# Patient Record
Sex: Male | Born: 1990 | Race: Black or African American | Hispanic: No | Marital: Single | Smoking: Current every day smoker
Health system: Southern US, Community
[De-identification: ages and names within clinical notes are randomized; demographics above are authoritative.]

## PROBLEM LIST (undated history)

## (undated) DIAGNOSIS — B20 Human immunodeficiency virus [HIV] disease: Secondary | ICD-10-CM

## (undated) DIAGNOSIS — Z720 Tobacco use: Secondary | ICD-10-CM

## (undated) HISTORY — DX: Tobacco use: Z72.0

---
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
## 2010-01-14 ENCOUNTER — Encounter: Payer: Self-pay | Admitting: Adult Health

## 2010-02-08 NOTE — Miscellaneous (Signed)
Summary: Bridge Counselor  Clinical Lists Changes BC called pt today to find out why he did not come to his lab appointment after he told [facility] he was coming. Pt's phone is disconnected. BC will send a letter and make home visit, if needed.  Sharol Roussel  October 06, 2009 3:13 PM

## 2010-02-08 NOTE — Assessment & Plan Note (Signed)
Summary: Nurse Visit (Infectious Disease)   Infectious Disease New Patient Intake Referring MD/Agency: [facility]  Address: [address]  [place], [place] [address]  Medical Records: Received Health Insurance / Payor: Private Employer: none      Do you have a Primary physician: No Are family members aware of patient's diagnosis?  If so, are they supportive? Mother and Grandmother- supportive Describe patient's current social support (family, friends, support groups): Good support  Medical History Medication Allergies: No   Medications: No   Tobacco Use: never  Behavioral Health Assessment Have you ever been diagnosed with depression or mental illness? No  Diagnosis: Bouts of depression since being diagnosed . Do you drink alcohol? No Do you use recreational drugs? No Do you feel you have a problem with drugs and/or alcohol? No   Have you ever been in a treatment facility for any addiction? No If you are currently using drugs and/or alcohol, would you like help for this addiction? No  HIV Intake Information When did you first test positive for HIV? 05/13/2009 Type of test Conducted: WB   Where was this test performed?  Name of Agency: [facility]  City/State: [place], [place]  Was this your first time ever being tested or HIV? No   LAST negative HIV test result: 02-2009 Name of Office: Random testing at School event    [place]: [place], [place]: [place] Risk Factor(s) for HIV: MSM  Method of Exposure to HIV: Homosexual Intercourse-Receptive Have you ever been hospitalized for any HIV-related condition? No  Have you ever been under the care of a physician for being HIV positive? No  Newly Diagnosed Patients Has a Disease Intervention Specialist from the Health Department contacted the patient? Yes.   The patient has been informed that the [facility] Department will contact ALL newly reported cases. Health Department Contact:  [phone]   (SSN is  needed for confirmation)  Reported Today: 11/10/2009 Health Department Contact:  [phone]            (SSN is needed for confirmation)  Person Reporting: prev reported/GHD Do you have any Non-HIV related medical conditions or other prior hospitalizations or surgeries? No  HIV Medications Information The patient is currently NOT taking any HIV medications.  Infection History  Patient has been diagnosed with the following opportunistic infections: Are there any other symptoms you need to discuss? No Have you received literature/education prior to this visit about HIV/AIDS? No Do you understand the meaning of a Viral Load? No Do you understand the meaning of a CD4 count? No Lab Values Education/Handout Given Yes Medication Education/Handout Given Yes  Sexual History Are you in a current relationship? No Safe Sex Counseling/Pamphlet Given Sexual History Comments: Last 6 months :1 male partner (unprotected) Last year : 73 male partners( protected )  Lifetime male partners 4 (unprotected)  No sex with females  Noe sex with IV drug users or prostitutes.  Evaluation and Follow-Up INTAKE CHECK LIST: HIV Education, Safe Sex Counseling, HIV Material Given  Prevention For Positives: 11/02/2009   Safe sex practices discussed with patient. Condoms offered. Are you in need of condoms at this time? No Our patient has been informed that condoms are always available in this clinic.   Brochure Provided for Above Organizations? Yes Name of Agency: [facility] Does patient have problems that warrant Social Worker referral? No  Immunization History:  DPT Immunization History:    DPT # 1:  historical (09/13/1990)  DPT # 2:  historical (12/13/1990)    DPT # 3:  historical (09/01/1991)    DPT # 4:  historical (08/04/1994)  Hepatitis B Immunization History:    Hepatitis B # 1:  historical (08/04/1994)    Hepatitis B # 2:  historical (08/16/2006)    Hepatitis B # 3:  historical (09/17/2006)    Hepatitis  B # 4:  historical (06/29/2009)  HIB Immunization History:    HIB # 1:  historical (09/13/1990)    HIB # 2:  historical (12/13/1990)    HIB # 3:  historical (03/04/1991)    HIB # 4:  historical (09/01/1991)  Polio Immunization History:    Polio # 1:  historical (09/13/1990)    Polio # 2:  historical (12/13/1990)    Polio # 3:  historical (09/01/1991)    Polio # 4:  historical (08/04/1994)  MMR Immunization History:    MMR # 1:  historical (09/01/1991)    MMR # 2:  historical (08/04/1994)  Influenza Immunization History:    Influenza:  historical (06/29/2009)  Tetanus/Td Immunization History:    Tetanus/Td:  historical (08/16/2006)  Pneumovax Immunization History:    Pneumovax:  historical (06/29/2009)   Depression History:      The patient is having a depressed mood most of the day and has a diminished interest in his usual daily activities.        The patient denies that he feels like life is not worth living, denies that he wishes that he were dead, and denies that he has thought about ending his life.           -  Date:  06/22/2009    CD4: 296    CD4%: 14.1    Hemoglobin: 15.2    WBC: 5.1    Platelets: 290         Medication Adherence: 11/02/2008   Adherence to medications reviewed with patient. Counseling to provide adequate adherence provided    Prevention For Positives: 11/02/2009   Safe sex practices discussed with patient. Condoms offered.        11/02/2009   Patient was screened for substance abuse and depression. Referal was made as indicated.

## 2010-02-08 NOTE — Miscellaneous (Signed)
Summary: Lab ordres   Clinical Lists Changes  Orders: Added new Test order of T-Chlamydia  Probe, urine [id number]) - Signed Added new Test order of T-CD4SP [facility]) (CD4SP) - Signed Added new Test order of T-GC Probe, urine [phone]) - Signed Added new Test order of T-CBC w/Diff [phone]) - Signed Added new Test order of T-Comprehensive Metabolic Panel [phone]) - Signed Added new Test order of T-Hepatitis B Surface Antigen [phone]) - Signed Added new Test order of T-Hepatitis B Surface Antibody [phone]) - Signed Added new Test order of T-Hepatitis B Core Antibody ([id number]) - Signed Added new Test order of T-Hepatitis C Antibody ([id number]) - Signed Added new Test order of T-Hepatitis A Antibody ([id number]) - Signed Added new Test order of T-HIV1 Quant rflx Ultra or Genotype ([id number]) - Signed Added new Test order of T-HIV Antibody  (Reflex) ([id number]) - Signed Added new Test order of T-Lipid Profile ([id number]) - Signed Added new Test order of T-RPR (Syphilis) ([id number]) - Signed Added new Test order of T-Urinalysis ([id number]) - Signed

## 2010-02-08 NOTE — Miscellaneous (Signed)
Summary: Bridge Counselor  Clinical Lists Changes BC enrolled pt into the bridge counseling program today. BC will assist pt with rescheduling and attending his appointments.  [facility]  September 07, 2009 1:40 PM

## 2010-02-08 NOTE — Progress Notes (Signed)
Summary: Paramedic Ref  Phone Note Outgoing Call   Call placed by: Tomasita Morrow RN,  August 06, 2009 11:01 AM Call placed to: BRidge counselors Summary of Call: No show for 08-03-09 new patient intake. will make Bridge Counselor referral. Tomasita Morrow RN  August 06, 2009 11:02 AM   New Problems: HIV INFECTION (ICD-042)   New Problems: HIV INFECTION (ICD-042)

## 2010-02-08 NOTE — Miscellaneous (Signed)
Summary: Bridge Counselor  Clinical Lists Changes BC called pt on August 1st and got no answer. [facility] sent a letter to pt requesting that pt call BC to discuss medical care on August 2nd. BC made a home visit on August 5th and pt was not home. BC called pt again today and still no answer. BC will send another letter to pt.  Sharol Roussel  August 17, 2009 10:36 AM

## 2010-02-08 NOTE — Miscellaneous (Signed)
Summary: Bridge Counselor  Clinical Lists Changes BC transported pt to [facility] today for his scheduled appointment with Traci Sermon. Pt was given an Rx for Bactrim due to his low CD4 count. BC discussed with pt how and when to take the medication. Pt declined a pillbox, condoms and lab tracker. BC then transported pt to [facility] in [facility] for ongoing case management services. BC will close pt's file with bridge counseling.

## 2010-02-08 NOTE — Miscellaneous (Signed)
Summary: HIPAA Restrictions  HIPAA Restrictions   Imported By: Florinda Marker 11/04/2009 14:15:22  _____________________________________________________________________  External Attachment:    Type:   Image     Comment:   External Document
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
## 2010-02-08 NOTE — Miscellaneous (Signed)
Summary: Bridge Counselor  Clinical Lists Changes BC transported pt to [facility] for intake with Tammy and lab work. BC also made a referral to [facility] for pt to have ongoing case management. Pt will return on 11/8 to see Nida Boatman and will then have an intake with [facility] at 10:30am with the [facility] office.

## 2010-02-10 NOTE — Miscellaneous (Signed)
  Clinical Lists Changes   Orders: Added new Service order of New Patient Level V [phone]) - Signed

## 2010-02-10 NOTE — Miscellaneous (Signed)
Summary: RW update  Clinical Lists Changes  Observations: Added new observation of RWPARTICIP: Yes (01/14/2010 14:02)

## 2010-02-23 ENCOUNTER — Encounter: Payer: Self-pay | Admitting: Adult Health

## 2010-03-02 NOTE — Consult Note (Signed)
Summary: [facility] Dept.: Immunization & Lab  [facility] Dept.: Immunization & Lab   Imported By: Florinda Marker 02/23/2010 10:35:24  _____________________________________________________________________  External Attachment:    Type:   Image     Comment:   External Document

## 2010-03-23 LAB — T-HELPER CELL (CD4) - (RCID CLINIC ONLY): CD4 T Cell Abs: 160 uL — ABNORMAL LOW (ref 400–2700)

## 2010-03-24 LAB — POCT INFECTIOUS MONO SCREEN: Mono Screen: NEGATIVE

## 2010-12-16 ENCOUNTER — Emergency Department (HOSPITAL_BASED_OUTPATIENT_CLINIC_OR_DEPARTMENT_OTHER)
Admission: EM | Admit: 2010-12-16 | Discharge: 2010-12-16 | Disposition: A | Discharge status: Home / Self Care | Payer: BC Managed Care – PPO | Attending: Emergency Medicine | Admitting: Emergency Medicine

## 2010-12-16 DIAGNOSIS — B029 Zoster without complications: Secondary | ICD-10-CM | POA: Insufficient documentation

## 2010-12-16 MED ORDER — NAPROXEN 500 MG PO TABS: 500.0000 mg | ORAL_TABLET | Freq: Two times a day (BID) | ORAL | Status: AC

## 2010-12-16 MED ORDER — ACYCLOVIR 400 MG PO TABS: 400.0000 mg | ORAL_TABLET | Freq: Four times a day (QID) | ORAL | Status: AC

## 2010-12-16 MED ORDER — ACYCLOVIR 400 MG PO TABS: 400.0000 mg | ORAL_TABLET | Freq: Four times a day (QID) | ORAL | Status: DC

## 2010-12-16 NOTE — ED Provider Notes (Signed)
History     CSN: [id number] Arrival date & time: 12/16/2010  6:29 PM   First MD Initiated Contact with Patient 12/16/10 1902      Chief Complaint  Patient presents with  . Rash    (Consider location/radiation/quality/duration/timing/severity/associated sxs/prior treatment) Patient is a 20 y.o. male presenting with rash. The history is provided by the patient.  Rash    patient here with rash to his left flank lower chest area x 5 days. Describes rash as tingling and painful. No medications taken prior to arrival denies any temperature at home or chills. Nothing makes her symptoms better or worse. No cough, neck pain, photophobia.  History reviewed. No pertinent past medical history.  History reviewed. No pertinent past surgical history.  No family history on file.  History  Substance Use Topics  . Smoking status: Never Smoker   . Smokeless tobacco: Never Used  . Alcohol Use: No      Review of Systems  Skin: Positive for rash.  All other systems reviewed and are negative.    Allergies  Review of patient's allergies indicates no known allergies.  Home Medications  No current outpatient prescriptions on file.  BP 125/80  Pulse 89  Temp(Src) 100.1 F (37.8 C) (Oral)  Resp 16  Ht 6' (1.829 m)  Wt 165 lb (74.844 kg)  BMI 22.38 kg/m2  SpO2 79%  Physical Exam  Nursing note and vitals reviewed. Constitutional: He is oriented to person, place, and time. He appears well-developed and well-nourished.  Non-toxic appearance. No distress.  HENT:  Head: Normocephalic and atraumatic.  Eyes: Conjunctivae, EOM and lids are normal. Pupils are equal, round, and reactive to light.  Neck: Normal range of motion. Neck supple. No tracheal deviation present. No mass present.  Cardiovascular: Normal rate, regular rhythm and normal heart sounds.  Exam reveals no gallop.   No murmur heard. Pulmonary/Chest: Effort normal and breath sounds normal. No stridor. No respiratory distress.  He has no decreased breath sounds. He has no wheezes. He has no rhonchi. He has no rales.  Abdominal: Soft. Normal appearance and bowel sounds are normal. He exhibits no distension. There is no tenderness. There is no rebound and no CVA tenderness.  Musculoskeletal: Normal range of motion. He exhibits no edema and no tenderness.       Arms: Neurological: He is alert and oriented to person, place, and time. He has normal strength. No cranial nerve deficit or sensory deficit. GCS eye subscore is 4. GCS verbal subscore is 5. GCS motor subscore is 6.  Skin: Skin is warm and dry. No abrasion and no rash noted.     Psychiatric: He has a normal mood and affect. His speech is normal and behavior is normal.    ED Course  Procedures (including critical care time)  Labs Reviewed - No data to display No results found.   No diagnosis found.    MDM  Patient to be treated for herpes zoster        Toy Baker, MD 12/16/10 [phone]

## 2010-12-16 NOTE — ED Notes (Signed)
Vesicular, urticarial rash on abdomen and back that started Monday.

## 2011-11-30 ENCOUNTER — Emergency Department (HOSPITAL_BASED_OUTPATIENT_CLINIC_OR_DEPARTMENT_OTHER): Payer: Self-pay

## 2011-11-30 ENCOUNTER — Encounter (HOSPITAL_BASED_OUTPATIENT_CLINIC_OR_DEPARTMENT_OTHER): Payer: Self-pay | Admitting: *Deleted

## 2011-11-30 ENCOUNTER — Emergency Department (HOSPITAL_BASED_OUTPATIENT_CLINIC_OR_DEPARTMENT_OTHER)
Admission: EM | Admit: 2011-11-30 | Discharge: 2011-11-30 | Disposition: A | Discharge status: Home / Self Care | Payer: Self-pay | Attending: Emergency Medicine | Admitting: Emergency Medicine

## 2011-11-30 DIAGNOSIS — M79609 Pain in unspecified limb: Secondary | ICD-10-CM | POA: Insufficient documentation

## 2011-11-30 DIAGNOSIS — M79673 Pain in unspecified foot: Secondary | ICD-10-CM

## 2011-11-30 MED ORDER — IBUPROFEN 600 MG PO TABS: 600.0000 mg | ORAL_TABLET | Freq: Four times a day (QID) | ORAL | Status: DC | PRN

## 2011-11-30 MED ORDER — IBUPROFEN 400 MG PO TABS
600.0000 mg | ORAL_TABLET | Freq: Once | ORAL | Status: AC
  Administered 2011-11-30: 600 mg via ORAL
  Filled 2011-11-30: qty 1

## 2011-11-30 NOTE — ED Provider Notes (Signed)
History     CSN: [id number]  Arrival date & time 11/30/11  1900   First MD Initiated Contact with Patient 11/30/11 2015      Chief Complaint  Patient presents with  . Foot Swelling    (Consider location/radiation/quality/duration/timing/severity/associated sxs/prior treatment) HPI Pt presenting with c/o pain in right medial surface of foot.  He does not remember any certain injury or twisting.  Area is sore to touch and he has some mild pain with walking.  Has not taken anything for his symtpoms.  No redness or rash or bruising associated.  Symptoms started yesterday.  There are no other associated systemic symptoms, there are no other alleviating or modifying factors.  History reviewed. No pertinent past medical history.  History reviewed. No pertinent past surgical history.  No family history on file.  History  Substance Use Topics  . Smoking status: Never Smoker   . Smokeless tobacco: Never Used  . Alcohol Use: No      Review of Systems ROS reviewed and all otherwise negative except for mentioned in HPI  Allergies  Review of patient's allergies indicates no known allergies.  Home Medications   Current Outpatient Rx  Name  Route  Sig  Dispense  Refill  . IBUPROFEN 600 MG PO TABS   Oral   Take 1 tablet (600 mg total) by mouth every 6 (six) hours as needed for pain.   30 tablet   0   . NAPROXEN 500 MG PO TABS   Oral   Take 1 tablet (500 mg total) by mouth 2 (two) times daily.   30 tablet   0     BP 125/79  Pulse 93  Temp 98.4 F (36.9 C) (Oral)  Resp 20  SpO2 99% Vitals reviewed Physical Exam Physical Examination: General appearance - alert, well appearing, and in no distress Mental status - alert, oriented to person, place, and time Chest - clear to auscultation, no wheezes, rales or rhonchi, symmetric air entry, no increased respiratory effort Heart - normal rate, regular rhythm, normal S1, S2, no murmurs, rubs, clicks or gallops Neurological -  alert, oriented, normal speech, strength 5/5 in extremities, sensation intact distally Musculoskeletal - ttp over medial right foot distal to ankle-no joint tenderness, deformity or swelling Extremities - peripheral pulses normal, no pedal edema, no clubbing or cyanosis Skin - normal coloration and turgor, no rashes, no suspicious skin lesions noted  ED Course  Procedures (including critical care time)  Labs Reviewed - No data to display Dg Foot Complete Right  11/30/2011  *RADIOLOGY REPORT*  Clinical Data: Medial side calcaneal pain and swelling, no known injury  RIGHT FOOT COMPLETE - 3+ VIEW  Comparison: None  Findings: Osseous mineralization normal. Joint spaces preserved. No acute fracture, dislocation, or bone destruction. No radiopaque foreign body or soft tissue gas identified.  IMPRESSION: Normal exam.   Original Report Authenticated By: Ulyses Southward, M.D.      1. Foot pain       MDM  Pt presenting with c/o pain in right medial foot.  Xray reassuring.  No signs of cellulitis, joints of ankle and foot are normal.  There is one area of medial foot that is tender to touch and painful with flexion of foot.  Will apply ASO for support, advised ibuprofen, rest, ice.  Advised to f/u with PMD as outpatient if pain does not improve after 3-5 days.  Discharged with strict return precautions.  Pt agreeable with plan.  Ethelda Chick, MD 11/30/11 2130

## 2011-11-30 NOTE — ED Notes (Signed)
Right foot has been hurting x 2 days. Swelling. No known injury.

## 2012-10-07 ENCOUNTER — Encounter (HOSPITAL_BASED_OUTPATIENT_CLINIC_OR_DEPARTMENT_OTHER): Payer: Self-pay | Admitting: *Deleted

## 2012-10-07 ENCOUNTER — Emergency Department (HOSPITAL_BASED_OUTPATIENT_CLINIC_OR_DEPARTMENT_OTHER)
Admission: EM | Admit: 2012-10-07 | Discharge: 2012-10-08 | Disposition: A | Discharge status: Home / Self Care | Payer: BC Managed Care – PPO | Attending: Emergency Medicine | Admitting: Emergency Medicine

## 2012-10-07 DIAGNOSIS — K529 Noninfective gastroenteritis and colitis, unspecified: Secondary | ICD-10-CM

## 2012-10-07 DIAGNOSIS — K5289 Other specified noninfective gastroenteritis and colitis: Secondary | ICD-10-CM | POA: Insufficient documentation

## 2012-10-07 LAB — COMPREHENSIVE METABOLIC PANEL
Albumin: 3.8 g/dL (ref 3.5–5.2)
Alkaline Phosphatase: 83 U/L (ref 39–117)
BUN: 8 mg/dL (ref 6–23)
Calcium: 9.2 mg/dL (ref 8.4–10.5)
Creatinine, Ser: 1.1 mg/dL (ref 0.50–1.35)
GFR calc Af Amer: >90 mL/min (ref >90)
Glucose, Bld: 86 mg/dL (ref 70–99)
Total Protein: 7.6 g/dL (ref 6.0–8.3)

## 2012-10-07 LAB — CBC WITH DIFFERENTIAL/PLATELET
Basophils Relative: 0 % (ref 0–1)
Eosinophils Absolute: 0.6 10*3/uL (ref 0.0–0.7)
Eosinophils Relative: 8 % — ABNORMAL HIGH (ref 0–5)
Hemoglobin: 13.1 g/dL (ref 13.0–17.0)
Lymphs Abs: 1.5 10*3/uL (ref 0.7–4.0)
MCH: 29.3 pg (ref 26.0–34.0)
MCHC: 36.9 g/dL — ABNORMAL HIGH (ref 30.0–36.0)
MCV: 79.4 fL (ref 78.0–100.0)
Monocytes Absolute: 0.9 10*3/uL (ref 0.1–1.0)
Monocytes Relative: 12 % (ref 3–12)
Neutrophils Relative %: 58 % (ref 43–77)
RBC: 4.47 MIL/uL (ref 4.22–5.81)

## 2012-10-07 MED ORDER — KETOROLAC TROMETHAMINE 30 MG/ML IJ SOLN
30.0000 mg | Freq: Once | INTRAMUSCULAR | Status: AC
  Administered 2012-10-07: 30 mg via INTRAVENOUS
  Filled 2012-10-07: qty 1

## 2012-10-07 MED ORDER — ONDANSETRON HCL 4 MG/2ML IJ SOLN
4.0000 mg | Freq: Once | INTRAMUSCULAR | Status: AC
  Administered 2012-10-07: 4 mg via INTRAVENOUS
  Filled 2012-10-07: qty 2

## 2012-10-07 MED ORDER — SODIUM CHLORIDE 0.9 % IV BOLUS (SEPSIS)
1000.0000 mL | Freq: Once | INTRAVENOUS | Status: AC
  Administered 2012-10-07: 1000 mL via INTRAVENOUS

## 2012-10-07 NOTE — ED Provider Notes (Signed)
CSN: [id number]     Arrival date & time 10/07/12  2032 History   First MD Initiated Contact with Patient 10/07/12 2242     Chief Complaint  Patient presents with  . Abdominal Pain  . Emesis  . Diarrhea   (Consider location/radiation/quality/duration/timing/severity/associated sxs/prior Treatment) Patient is a 22 y.o. male presenting with abdominal pain. The history is provided by the patient.  Abdominal Pain Pain location:  Generalized Pain quality: cramping   Pain radiates to:  Does not radiate Pain severity:  Moderate Onset quality:  Sudden Duration:  12 hours Timing:  Constant Progression:  Worsening Chronicity:  New Context: not diet changes, not recent illness, not sick contacts and not suspicious food intake   Relieved by:  Nothing Worsened by:  Nothing tried Ineffective treatments:  None tried   History reviewed. No pertinent past medical history. History reviewed. No pertinent past surgical history. No family history on file. History  Substance Use Topics  . Smoking status: Never Smoker   . Smokeless tobacco: Never Used  . Alcohol Use: No    Review of Systems  Gastrointestinal: Positive for abdominal pain.  All other systems reviewed and are negative.    Allergies  Review of patient's allergies indicates no known allergies.  Home Medications   Current Outpatient Rx  Name  Route  Sig  Dispense  Refill  . ibuprofen (ADVIL,MOTRIN) 600 MG tablet   Oral   Take 1 tablet (600 mg total) by mouth every 6 (six) hours as needed for pain.   30 tablet   0    BP 120/70  Pulse 87  Temp(Src) 100.1 F (37.8 C) (Oral)  Resp 16  Ht 6\' 1"  (1.854 m)  Wt 160 lb (72.576 kg)  BMI 21.11 kg/m2  SpO2 100% Physical Exam  Nursing note and vitals reviewed. Constitutional: He appears well-developed and well-nourished. No distress.  HENT:  Head: Normocephalic and atraumatic.  Mouth/Throat: Oropharynx is clear and moist.  Neck: Normal range of motion. Neck supple.   Cardiovascular: Normal rate, regular rhythm and normal heart sounds.   No murmur heard. Pulmonary/Chest: Effort normal and breath sounds normal. No respiratory distress. He has no wheezes.  Abdominal: Soft. Bowel sounds are normal. He exhibits no distension. There is no tenderness.  There is no RLQ ttp.  Skin: He is not diaphoretic.    ED Course  Procedures (including critical care time) Labs Review Labs Reviewed  CBC WITH DIFFERENTIAL  COMPREHENSIVE METABOLIC PANEL   Imaging Review No results found.  MDM  No diagnosis found. Patient presents here with complaints of abdominal cramping, nausea, vomiting, and diarrhea that started earlier this morning. The presentation, exam, and workup are all consistent with a viral etiology. Laboratory studies revealed no elevation of the Hobby count and electrolytes are unremarkable. There is no right lower quadrant tenderness that makes a suspicious for appendicitis. He was given intravenous fluids and medications and is feeling better. At this point I feel as though he is stable for discharge. He is to return for worsening of his symptoms or if he develops new or bothersome symptoms.    Geoffery Lyons, MD 10/08/12 [id number]

## 2012-10-07 NOTE — ED Notes (Signed)
Pt reports abd cramping today-  n/v x 2 - diarrhea 5-10 times

## 2012-10-08 MED ORDER — PROMETHAZINE HCL 25 MG PO TABS: 25.0000 mg | ORAL_TABLET | Freq: Four times a day (QID) | ORAL | Status: DC | PRN

## 2013-08-03 ENCOUNTER — Emergency Department (HOSPITAL_BASED_OUTPATIENT_CLINIC_OR_DEPARTMENT_OTHER)
Admission: EM | Admit: 2013-08-03 | Discharge: 2013-08-03 | Disposition: A | Discharge status: Home / Self Care | Payer: BC Managed Care – PPO | Attending: Emergency Medicine | Admitting: Emergency Medicine

## 2013-08-03 ENCOUNTER — Encounter (HOSPITAL_BASED_OUTPATIENT_CLINIC_OR_DEPARTMENT_OTHER): Payer: Self-pay | Admitting: Emergency Medicine

## 2013-08-03 ENCOUNTER — Emergency Department (HOSPITAL_BASED_OUTPATIENT_CLINIC_OR_DEPARTMENT_OTHER): Payer: BC Managed Care – PPO

## 2013-08-03 DIAGNOSIS — M79609 Pain in unspecified limb: Secondary | ICD-10-CM | POA: Insufficient documentation

## 2013-08-03 DIAGNOSIS — L02419 Cutaneous abscess of limb, unspecified: Secondary | ICD-10-CM | POA: Insufficient documentation

## 2013-08-03 DIAGNOSIS — L03119 Cellulitis of unspecified part of limb: Principal | ICD-10-CM

## 2013-08-03 DIAGNOSIS — L03116 Cellulitis of left lower limb: Secondary | ICD-10-CM

## 2013-08-03 DIAGNOSIS — Z23 Encounter for immunization: Secondary | ICD-10-CM | POA: Insufficient documentation

## 2013-08-03 LAB — BASIC METABOLIC PANEL
Anion gap: 10 (ref 5–15)
BUN: 7 mg/dL (ref 6–23)
CALCIUM: 9 mg/dL (ref 8.4–10.5)
CO2: 29 mEq/L (ref 19–32)
Chloride: 100 mEq/L (ref 96–112)
Creatinine, Ser: 1 mg/dL (ref 0.50–1.35)
Glucose, Bld: 92 mg/dL (ref 70–99)
POTASSIUM: 3.8 meq/L (ref 3.7–5.3)
SODIUM: 139 meq/L (ref 137–147)

## 2013-08-03 LAB — CBC WITH DIFFERENTIAL/PLATELET
BASOS PCT: 0 % (ref 0–1)
Basophils Absolute: 0 10*3/uL (ref 0.0–0.1)
EOS ABS: 0.8 10*3/uL — AB (ref 0.0–0.7)
EOS PCT: 16 % — AB (ref 0–5)
HCT: 31.3 % — ABNORMAL LOW (ref 39.0–52.0)
Hemoglobin: 11.4 g/dL — ABNORMAL LOW (ref 13.0–17.0)
Lymphocytes Relative: 19 % (ref 12–46)
Lymphs Abs: 1 10*3/uL (ref 0.7–4.0)
MCH: 28.8 pg (ref 26.0–34.0)
MCHC: 36.4 g/dL — AB (ref 30.0–36.0)
MCV: 79 fL (ref 78.0–100.0)
MONOS PCT: 10 % (ref 3–12)
Monocytes Absolute: 0.5 10*3/uL (ref 0.1–1.0)
NEUTROS PCT: 56 % (ref 43–77)
Neutro Abs: 2.8 10*3/uL (ref 1.7–7.7)
PLATELETS: 346 10*3/uL (ref 150–400)
RBC: 3.96 MIL/uL — ABNORMAL LOW (ref 4.22–5.81)
RDW: 13.7 % (ref 11.5–15.5)
WBC: 5.1 10*3/uL (ref 4.0–10.5)

## 2013-08-03 MED ORDER — TRAMADOL HCL 50 MG PO TABS: 50.0000 mg | ORAL_TABLET | Freq: Four times a day (QID) | ORAL | Status: DC | PRN

## 2013-08-03 MED ORDER — TETANUS-DIPHTH-ACELL PERTUSSIS 5-2.5-18.5 LF-MCG/0.5 IM SUSP
0.5000 mL | Freq: Once | INTRAMUSCULAR | Status: AC
  Administered 2013-08-03: 0.5 mL via INTRAMUSCULAR
  Filled 2013-08-03: qty 0.5

## 2013-08-03 MED ORDER — CLINDAMYCIN HCL 300 MG PO CAPS: 300.0000 mg | ORAL_CAPSULE | Freq: Four times a day (QID) | ORAL | Status: DC

## 2013-08-03 MED ORDER — CLINDAMYCIN HCL 150 MG PO CAPS
300.0000 mg | ORAL_CAPSULE | Freq: Once | ORAL | Status: AC
  Administered 2013-08-03: 300 mg via ORAL
  Filled 2013-08-03: qty 2

## 2013-08-03 NOTE — ED Provider Notes (Signed)
CSN: [id number]     Arrival date & time 08/03/13  [id number] History  This chart was scribed for Rolan BuccoMelanie Torina Ey, MD by Nicholos Johnsenise Iheanachor, ED scribe. This patient was seen in room MH08/MH08 and the patient's care was started at 6:59 PM.    Chief Complaint  Patient presents with  . Leg Pain    The history is provided by the patient. No language interpreter was used.   HPI Comments: Carl Bradley is a 23 y.o. male who presents to the Emergency Department complaining of left lower leg pain w/ swelling; onset a couple of days ago. Pain primarily on the lateral lower leg and into the ankle. No injury or trauma associated with current pain. Does not report any recent stings or bug bites. Unsure if tetanus is UTD. Denies chest pain, SOB,or fever.  No past medical history on file. No past surgical history on file. No family history on file. History  Substance Use Topics  . Smoking status: Never Smoker   . Smokeless tobacco: Never Used  . Alcohol Use: No    Review of Systems  Constitutional: Negative for fever.  Respiratory: Negative for shortness of breath.   Cardiovascular: Positive for leg swelling. Negative for chest pain.  Gastrointestinal: Negative for nausea and vomiting.  Musculoskeletal: Positive for arthralgias, joint swelling and myalgias. Negative for back pain and neck pain.  Skin: Negative for wound.  Neurological: Negative for weakness, numbness and headaches.  All other systems reviewed and are negative.  Allergies  Review of patient's allergies indicates no known allergies.  Home Medications   Prior to Admission medications   Medication Sig Start Date End Date Taking? Authorizing Provider  clindamycin (CLEOCIN) 300 MG capsule Take 1 capsule (300 mg total) by mouth 4 (four) times daily. X 7 days 08/03/13   Rolan BuccoMelanie Saki Legore, MD  traMADol (ULTRAM) 50 MG tablet Take 1 tablet (50 mg total) by mouth every 6 (six) hours as needed. 08/03/13   Rolan BuccoMelanie Juri Dinning, MD   Triage vitals: BP 111/57   Pulse 97  Temp(Src) 97.8 F (36.6 C) (Oral)  Resp 16  Ht 6' (1.829 m)  Wt 150 lb (68.04 kg)  BMI 20.34 kg/m2  SpO2 100%  Physical Exam  Nursing note and vitals reviewed. Constitutional: He is oriented to person, place, and time. He appears well-developed and well-nourished.  HENT:  Head: Normocephalic and atraumatic.  Neck: Normal range of motion. Neck supple.  Cardiovascular: Normal rate.   Pulses:      Dorsalis pedis pulses are 2+ on the left side.  Pulmonary/Chest: Effort normal.  Musculoskeletal: He exhibits edema and tenderness.       Left ankle: He exhibits swelling.       Left lower leg: He exhibits tenderness and swelling. He exhibits no bony tenderness.       Left foot: He exhibits swelling.  Swelling of the left lower leg to the calf, ankle, and foot. There is erythema to the lateral aspect of the calf. Does not extend to the ankle or the knee. Pulses intact in the foot. No induration or fluctuance. No bony tenderness. Tenderness on palpation of the calf primarily at the lateral aspect.   Neurological: He is alert and oriented to person, place, and time.  Skin: Skin is warm and dry.  Psychiatric: He has a normal mood and affect.    ED Course  Procedures (including critical care time) DIAGNOSTIC STUDIES: Oxygen Saturation is 100% on room air, normal by my interpretation.    COORDINATION  OF CARE: At 7:00 PM: Discussed treatment plan with patient which includes CT scan of the left lower leg. Patient agrees.    Labs Review Labs Reviewed  CBC WITH DIFFERENTIAL - Abnormal; Notable for the following:    RBC 3.96 (*)    Hemoglobin 11.4 (*)    HCT 31.3 (*)    MCHC 36.4 (*)    Eosinophils Relative 16 (*)    Eosinophils Absolute 0.8 (*)    All other components within normal limits  BASIC METABOLIC PANEL    Imaging Review US Venous Img Lower Unilateral Left  08/03/2013   CLINICAL DATA:  Left calf swelling and pain with redness and warmth to touch for 2 days with no  known injury  EXAM: LEFT LOWER EXTREMITY VENOUS DOPPLER ULTRASOUND  TECHNIQUE: Gray-scale sonography with graded compression, as well as color Doppler and duplex ultrasound were performed to evaluate the lower extremity deep venous systems from the level of the common femoral vein and including the common femoral, femoral, profunda femoral, popliteal and calf veins including the posterior tibial, peroneal and gastrocnemius veins when visible. The superficial great saphenous vein was also interrogated. Spectral Doppler was utilized to evaluate flow at rest and with distal augmentation maneuvers in the common femoral, femoral and popliteal veins.  COMPARISON:  None.  FINDINGS: Common Femoral Vein: No evidence of thrombus. Normal compressibility, respiratory phasicity and response to augmentation.  Saphenofemoral Junction: No evidence of thrombus. Normal compressibility and flow on color Doppler imaging.  Profunda Femoral Vein: No evidence of thrombus. Normal compressibility and flow on color Doppler imaging.  Femoral Vein: No evidence of thrombus. Normal compressibility, respiratory phasicity and response to augmentation.  Popliteal Vein: No evidence of thrombus. Normal compressibility, respiratory phasicity and response to augmentation.  Calf Veins: No evidence of thrombus. Normal compressibility and flow on color Doppler imaging.  Other Findings: Enlarged left inguinal lymph nodes, measuring up to about 3 cm in diameter. In the area of tenderness in the lateral anterior cath, subcutaneous edema is noted, mild.  IMPRESSION: Mild nonspecific soft tissue edematous change in the area of tenderness. No evidence of deep venous thrombosis.  Left inguinal adenopathy.   Electronically Signed   By: Esperanza Heir M.D.   On: 08/03/2013 19:50     EKG Interpretation None      MDM   Final diagnoses:  Cellulitis of left lower extremity   No evidence of DVT.  Likely cellulitis.  No evident abscess.  Will start  clindamycin, ultram for pain.  Advised elevation.  Return if no better in 48 hours or for any worsening symptoms.  TD updated.  I personally performed the services described in this documentation, which was scribed in my presence. The recorded information has been reviewed and is accurate.     Rolan Bucco, MD 08/03/13 2134

## 2013-08-03 NOTE — ED Notes (Signed)
C/o Left leg pain x 2 days with pain and swelling. Redness noted, warm and tender to touch.

## 2013-08-03 NOTE — ED Notes (Signed)
Pt having left leg pain x 2 days.  No recent travel or injury.

## 2013-08-03 NOTE — ED Notes (Signed)
Patient transported to Ultrasound 

## 2013-08-03 NOTE — Discharge Instructions (Signed)

## 2013-09-27 ENCOUNTER — Encounter (HOSPITAL_BASED_OUTPATIENT_CLINIC_OR_DEPARTMENT_OTHER): Payer: Self-pay | Admitting: Emergency Medicine

## 2013-09-27 ENCOUNTER — Emergency Department (HOSPITAL_BASED_OUTPATIENT_CLINIC_OR_DEPARTMENT_OTHER)
Admission: EM | Admit: 2013-09-27 | Discharge: 2013-09-28 | Disposition: A | Discharge status: Home / Self Care | Payer: BC Managed Care – PPO | Attending: Emergency Medicine | Admitting: Emergency Medicine

## 2013-09-27 ENCOUNTER — Emergency Department (HOSPITAL_BASED_OUTPATIENT_CLINIC_OR_DEPARTMENT_OTHER): Payer: BC Managed Care – PPO

## 2013-09-27 DIAGNOSIS — L03115 Cellulitis of right lower limb: Secondary | ICD-10-CM

## 2013-09-27 DIAGNOSIS — Z21 Asymptomatic human immunodeficiency virus [HIV] infection status: Secondary | ICD-10-CM

## 2013-09-27 DIAGNOSIS — B20 Human immunodeficiency virus [HIV] disease: Secondary | ICD-10-CM

## 2013-09-27 DIAGNOSIS — L02419 Cutaneous abscess of limb, unspecified: Secondary | ICD-10-CM | POA: Insufficient documentation

## 2013-09-27 DIAGNOSIS — L03119 Cellulitis of unspecified part of limb: Principal | ICD-10-CM

## 2013-09-27 DIAGNOSIS — K610 Anal abscess: Secondary | ICD-10-CM

## 2013-09-27 DIAGNOSIS — F17200 Nicotine dependence, unspecified, uncomplicated: Secondary | ICD-10-CM | POA: Insufficient documentation

## 2013-09-27 DIAGNOSIS — K612 Anorectal abscess: Secondary | ICD-10-CM | POA: Insufficient documentation

## 2013-09-27 DIAGNOSIS — M79609 Pain in unspecified limb: Secondary | ICD-10-CM | POA: Insufficient documentation

## 2013-09-27 HISTORY — DX: Human immunodeficiency virus (HIV) disease: B20

## 2013-09-27 LAB — CBC WITH DIFFERENTIAL/PLATELET
BASOS ABS: 0 10*3/uL (ref 0.0–0.1)
BASOS PCT: 0 % (ref 0–1)
EOS ABS: 0.8 10*3/uL — AB (ref 0.0–0.7)
EOS PCT: 10 % — AB (ref 0–5)
HCT: 31.1 % — ABNORMAL LOW (ref 39.0–52.0)
Hemoglobin: 11.2 g/dL — ABNORMAL LOW (ref 13.0–17.0)
LYMPHS ABS: 1.3 10*3/uL (ref 0.7–4.0)
Lymphocytes Relative: 16 % (ref 12–46)
MCH: 28 pg (ref 26.0–34.0)
MCHC: 36 g/dL (ref 30.0–36.0)
MCV: 77.8 fL — ABNORMAL LOW (ref 78.0–100.0)
Monocytes Absolute: 0.7 10*3/uL (ref 0.1–1.0)
Monocytes Relative: 9 % (ref 3–12)
NEUTROS PCT: 65 % (ref 43–77)
Neutro Abs: 5.2 10*3/uL (ref 1.7–7.7)
PLATELETS: 356 10*3/uL (ref 150–400)
RBC: 4 MIL/uL — AB (ref 4.22–5.81)
RDW: 15.3 % (ref 11.5–15.5)
WBC: 8 10*3/uL (ref 4.0–10.5)

## 2013-09-27 LAB — BASIC METABOLIC PANEL
ANION GAP: 12 (ref 5–15)
BUN: 7 mg/dL (ref 6–23)
CALCIUM: 8.9 mg/dL (ref 8.4–10.5)
CO2: 26 mEq/L (ref 19–32)
Chloride: 100 mEq/L (ref 96–112)
Creatinine, Ser: 0.9 mg/dL (ref 0.50–1.35)
Glucose, Bld: 92 mg/dL (ref 70–99)
POTASSIUM: 4 meq/L (ref 3.7–5.3)
SODIUM: 138 meq/L (ref 137–147)

## 2013-09-27 LAB — RAPID HIV SCREEN (WH-MAU): SUDS RAPID HIV SCREEN: REACTIVE — AB

## 2013-09-27 MED ORDER — IOHEXOL 300 MG/ML  SOLN
100.0000 mL | Freq: Once | INTRAMUSCULAR | Status: AC | PRN
  Administered 2013-09-27: 100 mL via INTRAVENOUS

## 2013-09-27 MED ORDER — MORPHINE SULFATE 4 MG/ML IJ SOLN
4.0000 mg | Freq: Once | INTRAMUSCULAR | Status: AC
  Administered 2013-09-27: 4 mg via INTRAVENOUS
  Filled 2013-09-27: qty 1

## 2013-09-27 NOTE — ED Provider Notes (Signed)
CSN: [id number]     Arrival date & time 09/27/13  1903 History  This chart was scribed for Toy Cookey, MD by [place] Peace, ED Scribe. The patient was seen in [facility]. The patient's care was started at 9:31 PM.    Chief Complaint  Patient presents with  . Abscess      HPI HPI Comments: Carl Bradley is a 23 y.o. male who presents to the Emergency Department complaining of abscess to buttocks onset 1 day ago. Pt reports some tenderness to area. He adds that he has been experiencing some pain with bowel movements. He reports history of previous abscess to same area in the past and states he had I&D performed at ED. Pt denies any noticed drainage.  Pt complains of left foot pain onset 2 weeks ago with associated swelling, skin peeling, and pruritis. Pt states that he was on some antibiotics about 1 month ago. Pt states that he put some [employer] baby lotion on the affected area without much relief. Pt is current every day smoker.   Past Medical History  Diagnosis Date  . HIV disease    History reviewed. No pertinent past surgical history. History reviewed. No pertinent family history. History  Substance Use Topics  . Smoking status: Current Every Day Smoker  . Smokeless tobacco: Never Used  . Alcohol Use: Yes     Comment: occasional    Review of Systems  Constitutional: Negative for fever, activity change, appetite change and fatigue.  HENT: Negative for congestion, facial swelling, rhinorrhea and trouble swallowing.   Eyes: Negative for photophobia and pain.  Respiratory: Negative for cough, chest tightness and shortness of breath.   Cardiovascular: Negative for chest pain and leg swelling.  Gastrointestinal: Negative for nausea, vomiting, abdominal pain, diarrhea and constipation.       Pain with bowel movements specifically to affected area.   Endocrine: Negative for polydipsia and polyuria.  Genitourinary: Negative for dysuria, urgency, decreased urine volume and  difficulty urinating.  Musculoskeletal: Negative for back pain and gait problem.  Skin: Positive for rash and wound (abscess). Negative for color change.       Peeling of plantar aspect of left foot.   Allergic/Immunologic: Negative for immunocompromised state.  Neurological: Negative for dizziness, facial asymmetry, speech difficulty, weakness, numbness and headaches.  Psychiatric/Behavioral: Negative for confusion, decreased concentration and agitation.      Allergies  Review of patient's allergies indicates no known allergies.  Home Medications   Prior to Admission medications   Medication Sig Start Date End Date Taking? Authorizing Provider  clindamycin (CLEOCIN) 150 MG capsule Take 3 capsules (450 mg total) by mouth 3 (three) times daily. For 10 days 09/28/13   Toy Cookey, MD  clindamycin (CLEOCIN) 300 MG capsule Take 1 capsule (300 mg total) by mouth 4 (four) times daily. X 7 days 08/03/13   Rolan Bucco, MD  oxyCODONE-acetaminophen (PERCOCET) 5-325 MG per tablet Take 1-2 tablets by mouth every 6 (six) hours as needed. 09/28/13   Toy Cookey, MD  traMADol (ULTRAM) 50 MG tablet Take 1 tablet (50 mg total) by mouth every 6 (six) hours as needed. 08/03/13   Rolan Bucco, MD   BP 129/82  Pulse 88  Temp(Src) 98.9 F (37.2 C) (Oral)  Resp 18  Ht 6' (1.829 m)  Wt 150 lb (68.04 kg)  BMI 20.34 kg/m2  SpO2 100% Physical Exam  Constitutional: He is oriented to person, place, and time. He appears well-developed and well-nourished. No distress.  HENT:  Head: Normocephalic and atraumatic.  Mouth/Throat: No oropharyngeal exudate.  Eyes: Pupils are equal, round, and reactive to light.  Neck: Normal range of motion. Neck supple.  Cardiovascular: Normal rate, regular rhythm and normal heart sounds.  Exam reveals no gallop and no friction rub.   No murmur heard. Pulmonary/Chest: Effort normal and breath sounds normal. No respiratory distress. He has no wheezes. He has no rales.   Abdominal: Soft. Bowel sounds are normal. He exhibits no distension and no mass. There is no tenderness. There is no rebound and no guarding.  Genitourinary:  Tender irregularly shaped 2 cm x 3 cm rectal mass. Significant rectal tenderness on rectal exam.   Musculoskeletal: Normal range of motion. He exhibits edema (2+ LLE pitting edema and mild erythema. Scaling of skin around plantar surface of foot. ). He exhibits no tenderness.  Edema, erythema, warmth of left lower extremity. Worse at the lateral aspect of lower leg. Neurovascularly intact distally.  Neurological: He is alert and oriented to person, place, and time.  Skin: Skin is warm and dry.  Psychiatric: He has a normal mood and affect.    ED Course  INCISION AND DRAINAGE Date/Time: 09/28/2013 12:57 AM Performed by: Toy Cookey Authorized by: Toy Cookey Consent: Verbal consent obtained. written consent not obtained. Risks and benefits: risks, benefits and alternatives were discussed Consent given by: patient Patient understanding: patient states understanding of the procedure being performed Patient consent: the patient's understanding of the procedure matches consent given Procedure consent: procedure consent matches procedure scheduled Relevant documents: relevant documents present and verified Test results: test results available and properly labeled Site marked: the operative site was marked Imaging studies: imaging studies available Required items: required blood products, implants, devices, and special equipment available Patient identity confirmed: verbally with patient Time out: Immediately prior to procedure a "time out" was called to verify the correct patient, procedure, equipment, support staff and site/side marked as required. Type: abscess Body area: anogenital Location details: perianal Anesthesia: local infiltration Local anesthetic: lidocaine 2% without epinephrine Anesthetic total: 2 ml Patient  sedated: no Scalpel size: 11 Incision type: single straight Complexity: simple Drainage: purulent Drainage amount: moderate Packing material: 1/4 in iodoform gauze Patient tolerance: Patient tolerated the procedure well with no immediate complications.   (including critical care time) Labs Review Labs Reviewed  CBC WITH DIFFERENTIAL - Abnormal; Notable for the following:    RBC 4.00 (*)    Hemoglobin 11.2 (*)    HCT 31.1 (*)    MCV 77.8 (*)    Eosinophils Relative 10 (*)    Eosinophils Absolute 0.8 (*)    All other components within normal limits  RAPID HIV SCREEN ([facility]) - Abnormal; Notable for the following:    SUDS Rapid HIV Screen Reactive (*)    All other components within normal limits  BASIC METABOLIC PANEL  T-HELPER CELLS (CD4) COUNT  HIV 1/2 CONFIRMATION    Results for orders placed during the hospital encounter of 09/27/13  CBC WITH DIFFERENTIAL      Result Value Ref Range   WBC 8.0  4.0 - 10.5 K/uL   RBC 4.00 (*) 4.22 - 5.81 MIL/uL   Hemoglobin 11.2 (*) 13.0 - 17.0 g/dL   HCT 16.1 (*) 09.6 - 04.5 %   MCV 77.8 (*) 78.0 - 100.0 fL   MCH 28.0  26.0 - 34.0 pg   MCHC 36.0  30.0 - 36.0 g/dL   RDW 40.9  81.1 - 91.4 %   Platelets 356  150 -  400 K/uL   Neutrophils Relative % 65  43 - 77 %   Neutro Abs 5.2  1.7 - 7.7 K/uL   Lymphocytes Relative 16  12 - 46 %   Lymphs Abs 1.3  0.7 - 4.0 K/uL   Monocytes Relative 9  3 - 12 %   Monocytes Absolute 0.7  0.1 - 1.0 K/uL   Eosinophils Relative 10 (*) 0 - 5 %   Eosinophils Absolute 0.8 (*) 0.0 - 0.7 K/uL   Basophils Relative 0  0 - 1 %   Basophils Absolute 0.0  0.0 - 0.1 K/uL  BASIC METABOLIC PANEL      Result Value Ref Range   Sodium 138  137 - 147 mEq/L   Potassium 4.0  3.7 - 5.3 mEq/L   Chloride 100  96 - 112 mEq/L   CO2 26  19 - 32 mEq/L   Glucose, Bld 92  70 - 99 mg/dL   BUN 7  6 - 23 mg/dL   Creatinine, Ser 1.32  0.50 - 1.35 mg/dL   Calcium 8.9  8.4 - 44.0 mg/dL   GFR calc non Af Amer >90  >90 mL/min   GFR  calc Af Amer >90  >90 mL/min   Anion gap 12  5 - 15  RAPID HIV SCREEN ([facility])      Result Value Ref Range   SUDS Rapid HIV Screen Reactive (*) NON REACTIVE   No results found.    Imaging Review Ct Pelvis W Contrast  09/28/2013   CLINICAL DATA:  Perianal abscess  EXAM: CT PELVIS WITH CONTRAST  TECHNIQUE: Multidetector CT imaging of the pelvis was performed using the standard protocol following the bolus administration of intravenous contrast.  CONTRAST:  OMNIPAQUE IOHEXOL 300 MG/ML  SOLN  COMPARISON:  None.  FINDINGS: There is low attenuation and fluid collection left of the anus at the 5 o'clock position measuring 10 x 5 mm. There is suggestive of fistulous tract to the left aspect the gluteal cleft seen on the delayed imaging (image 48, series 6). These soft tissues are not well evaluated by CT. There is no evidence of gas in the peritoneum.  In the pelvis, there is no evidence of abscess within the deep tissues of the retroperitoneum are peritoneal space. Bladder is normal. Rectum is grossly normal. No aggressive osseous lesion.  IMPRESSION: 1. Left perianal abscess with likely fistulous tract to the medial aspect of the left gluteal fold. Perianal abscesses are much better evaluated with MRI. Consider MRI for further characterization. 2. No evidence extension into the deep pelvis.   Electronically Signed   By: Genevive Bi M.D.   On: 09/28/2013 00:16     EKG Interpretation None     Medications  morphine 4 MG/ML injection 4 mg (4 mg Intravenous Given 09/27/13 2156)  iohexol (OMNIPAQUE) 300 MG/ML solution 100 mL (100 mLs Intravenous Contrast Given 09/27/13 2327)  HYDROmorphone (DILAUDID) injection 1 mg (1 mg Intravenous Given 09/28/13 0041)    9:37 PM- Treatment plan was discussed with patient who verbalizes understanding and agrees.   MDM   Final diagnoses:  HIV (human immunodeficiency virus infection)  Perianal abscess  Cellulitis of right leg    Pt is a 23 y.o. male  with report of no Pmhx, but has +hx of HIV since 2011 on chart review, who presents with several days of painful rectal mass, and reoccurrence of RLE pain/redness/edema and scaling of the skin around plantar surface of his foot. He was  seen for same on 08/02/13 for same and had neg DVT study, and PE is unchanged compared to that date.  I suspect recurrent cellulitis. He states symptoms had somewhat improved with PO abx at last visit. CT abdomen and pelvis ordered to rule out deep abscess.  When I ask him about his HIV states he denies having HIV and states he tested negative several weeks ago. He agrees to be tested tonight.   Rapid HIV is reactive and confirmation to be sent.    CT abdomen pelvis with left perianal abscess with likely fistulous tract into the medial aspect of the left gluteal fold. No evidence of extension into the deep pelvis. Attack I&D done with packing placed. He was placed on by mouth clindamycin and will given Percocet for pain. I strongly suggested that he followup with infectious disease for treatment of HIV and recurrent skin infections. And recommend he followup for general surgery for AV perianal abscess. Return precautions given for new worsening symptoms including worsening pain, fever, swelling.   I personally performed the services described in this documentation, which was scribed in my presence. The recorded information has been reviewed and is accurate.     Toy Cookey, MD 09/28/13 [phone]

## 2013-09-27 NOTE — ED Notes (Signed)
Pt reports abscess to sacral area that started yesterday. Also c/o left foot painful and swollen with skin peeling.

## 2013-09-28 LAB — HIV 1/2 CONFIRMATION
HIV 2 AB: NEGATIVE
HIV-1 antibody: POSITIVE — AB

## 2013-09-28 MED ORDER — CLINDAMYCIN HCL 150 MG PO CAPS: 450.0000 mg | ORAL_CAPSULE | Freq: Three times a day (TID) | ORAL | Status: DC

## 2013-09-28 MED ORDER — HYDROMORPHONE HCL 1 MG/ML IJ SOLN
1.0000 mg | Freq: Once | INTRAMUSCULAR | Status: AC
  Administered 2013-09-28: 1 mg via INTRAVENOUS
  Filled 2013-09-28: qty 1

## 2013-09-28 MED ORDER — OXYCODONE-ACETAMINOPHEN 5-325 MG PO TABS: 1.0000 | ORAL_TABLET | Freq: Four times a day (QID) | ORAL | Status: DC | PRN

## 2013-09-28 NOTE — Discharge Instructions (Signed)
Perianal Abscess °An abscess is an infected area that contains a collection of pus and debris. A perianal abscess is one that occurs in the perineal area, which is the area between the anus and the scrotum in males and between the anus and the vagina in females. Perianal abscesses can vary in size. Without treatment, a perianal abscess can become larger and cause other problems. °CAUSES  °Glands in the perineal area can become plugged up with debris. When this happens, an abscess may form.  °SIGNS AND SYMPTOMS  °The most common symptoms of a perianal abscess are: °· Swelling and redness in the area of the abscess. The redness may go beyond the abscess and appear as a red streak on the skin. °· Pain in the area of the abscess. °Other possible symptoms include:  °· A visible lump or a lump that can be felt when touching the area and is usually painful. °· Bleeding or pus-like discharge from the area. °· Fever. °· General weakness. °DIAGNOSIS  °Your health care provider will take a medical history and examine the area. This may involve examining the rectal area with a gloved hand (digital rectal exam). For women, it may require a careful vaginal exam. Sometimes, the health care provider needs to look into the rectum using a probe or scope. °TREATMENT  °Treatment often requires making a cut (incision) in the abscess to drain the pus. This can sometimes be done in your health care provider's office or an emergency department after giving you medicine to numb the area (local anesthetic). For larger or deeper abscesses, surgery may be required to drain the abscess. Antibiotic medicines are sometimes given if there is infection of the surrounding tissue (cellulitis). In some cases, gauze is packed into the abscess to continue draining the area. Frequent sitz baths may be recommended to help the wound heal and to reduce the chance of the abscess coming back. °HOME CARE INSTRUCTIONS  °· Only take over-the-counter or  prescription medicines for pain, fever, or discomfort as directed by your health care provider. °· Take antibiotic medicine as directed. Make sure you finish it even if you start to feel better. °· If gauze is used in the abscess, follow your health care provider's instructions for removing or changing the gauze. It can usually be removed in 2-3 days. °· If one or more drains have been placed in the abscess cavity, be careful not to pull at them. Your health care provider will tell you how long they need to remain in place. °· Take warm sitz baths 3-4 times a day and after bowel movements. This will help reduce pain and swelling. °· Keep the skin around the abscess clean and dry. Avoid cleaning the area too much. °· Avoid scratching the abscess area. °· Avoid using colored or perfumed toilet papers. °SEEK MEDICAL CARE IF:  °· You have trouble having a bowel movement or passing urine. °· Your pain or swelling in the affected area does not seem to be improving. °· The gauze packing or the drains come out before the planned time. °SEEK IMMEDIATE MEDICAL CARE IF:  °· You have problems moving or using your legs. °· You have severe or increasing pain. °· Your swelling in the affected area suddenly gets worse. °· You have a large increase in bleeding or passing of pus. °· You have chills or a fever. °MAKE SURE YOU:  °· Understand these instructions. °· Will watch your condition. °· Will get help right away if you are   not doing well or get worse. Document Released: 02/01/2006 Document Revised: 10/16/2012 Document Reviewed: 08/07/2012 [facility] Patient Information 2015 [place], [place]. This information is not intended to replace advice given to you by your health care provider. Make sure you discuss any questions you have with your health care provider. Cellulitis Cellulitis is an infection of the skin and the tissue beneath it. The infected area is usually red and tender. Cellulitis occurs most often in the arms and  lower legs.  CAUSES  Cellulitis is caused by bacteria that enter the skin through cracks or cuts in the skin. The most common types of bacteria that cause cellulitis are staphylococci and streptococci. SIGNS AND SYMPTOMS   Redness and warmth.  Swelling.  Tenderness or pain.  Fever. DIAGNOSIS  Your health care provider can usually determine what is wrong based on a physical exam. Blood tests may also be done. TREATMENT  Treatment usually involves taking an antibiotic medicine. HOME CARE INSTRUCTIONS   Take your antibiotic medicine as directed by your health care provider. Finish the antibiotic even if you start to feel better.  Keep the infected arm or leg elevated to reduce swelling.  Apply a warm cloth to the affected area up to 4 times per day to relieve pain.  Take medicines only as directed by your health care provider.  Keep all follow-up visits as directed by your health care provider. SEEK MEDICAL CARE IF:   You notice red streaks coming from the infected area.  Your red area gets larger or turns dark in color.  Your bone or joint underneath the infected area becomes painful after the skin has healed.  Your infection returns in the same area or another area.  You notice a swollen bump in the infected area.  You develop new symptoms.  You have a fever. SEEK IMMEDIATE MEDICAL CARE IF:   You feel very sleepy.  You develop vomiting or diarrhea.  You have a general ill feeling (malaise) with muscle aches and pains. MAKE SURE YOU:   Understand these instructions.  Will watch your condition.  Will get help right away if you are not doing well or get worse. Document Released: 10/05/2004 Document Revised: 05/12/2013 Document Reviewed: 03/13/2011 [facility] Patient Information 2015 [place], [place]. This information is not intended to replace advice given to you by your health care provider. Make sure you discuss any questions you have with your health care  provider.   Sitz Bath A sitz bath is a warm water bath taken in the sitting position that covers only the hips and buttocks. It may be used for either healing or hygiene purposes. Sitz baths are also used to relieve pain, itching, or muscle spasms. The water may contain medicine. Moist heat will help you heal and relax.  HOME CARE INSTRUCTIONS  Take 3 to 4 sitz baths a day. 1. Fill the bathtub half full with warm water. 2. Sit in the water and open the drain a little. 3. Turn on the warm water to keep the tub half full. Keep the water running constantly. 4. Soak in the water for 15 to 20 minutes. 5. After the sitz bath, pat the affected area dry first. SEEK MEDICAL CARE IF:  You get worse instead of better. Stop the sitz baths if you get worse. MAKE SURE YOU:  Understand these instructions.  Will watch your condition.  Will get help right away if you are not doing well or get worse. Document Released: 09/18/2003 Document Revised: 09/20/2011 Document Reviewed:  03/25/2010 [employer] Patient Information 2015 [place], [place]. This information is not intended to replace advice given to you by your health care provider. Make sure you discuss any questions you have with your health care provider.  HIV Antibody Test HIV is a virus which destroys our body's ability to fight illness. It does this by causing defects in our immune system. This is the system that protects our body against infections. This virus is the cause of an illness called AIDS. HIV antibodies are made by the infected person's body when a person becomes infected with HIV. WHAT IS THE HIV ANTIBODY TEST? This is a test for HIV antibodies that are found in the blood of an infected person. This test is not a test for AIDS. It only means that you have been infected with HIV and may eventually develop AIDS. WHO IS AT RISK OF BEING INFECTED WITH HIV?  People who have unsafe sex (unsafe sex means having sex without a condom (or other  protective barrier) with a person who has the virus.  People who share IV needles or syringes with a person who has the virus.  Anyone who got blood, blood products, or organ transplants before 1985.  Babies born to mothers who have HIV.  Coming in contact with blood or other body fluids of someone infected with HIV. WHAT DOES A NEGATIVE HIV TEST RESULT MEAN? HIV antibodies were not found in your blood. Most of the time it takes our bodies between 6 weeks and 6 months to develop antibodies to HIV. It may take up to one year to develop. During this time, infected people can have a negative result even if they have the virus and will therefore not know if they are putting other people at risk. They should take all necessary precautions to protect others from becoming infected. WHAT DOES A POSITIVE HIV TEST RESULT MEAN?  A positive HIV test means a person has been infected with the HIV virus. This does NOT mean that a person has AIDS, but they may eventually develop it.  A person can give HIV infection to other people through unsafe sex. Sharing IV needles or syringes can also spread HIV.  A woman who has HIV can give the virus to her baby during pregnancy or at birth or possibly from breastfeeding. Get counseling prior to considering pregnancy. One third to one half of women with HIV infection will pass this infection on to their baby.  Anyone with a positive test for HIV should not donate blood, plasma, blood products, organs or tissues. WHERE CAN I GO TO BE TESTED?  Most county health departments offer HIV Antibody counseling and testing.  Many doctors and other caregivers offer HIV Antibody counseling and testing. If you received a blood test from your caregiver, call for your results as instructed. Remember it is your responsibility to get the results of your test. Do not assume everything is fine if you do not hear from your caregiver. If you get a positive test result, talk to your  caregiver to find out what steps to take to assure you receive the best of care. Numerous medications are now available which improve the course of this infection. Document Released: 12/24/1999 Document Revised: 03/20/2011 Document Reviewed: 03/28/2013 [facility] Patient Information 2015 [place], [place]. This information is not intended to replace advice given to you by your health care provider. Make sure you discuss any questions you have with your health care provider.    Emergency Department Resource Guide 1)  Find a Doctor and Pay Out of Pocket Although you won't have to find out who is covered by your insurance plan, it is a good idea to ask around and get recommendations. You will then need to call the office and see if the doctor you have chosen will accept you as a new patient and what types of options they offer for patients who are self-pay. Some doctors offer discounts or will set up payment plans for their patients who do not have insurance, but you will need to ask so you aren't surprised when you get to your appointment.  2) Contact Your Local Health Department Not all health departments have doctors that can see patients for sick visits, but many do, so it is worth a call to see if yours does. If you don't know where your local health department is, you can check in your phone book. The CDC also has a tool to help you locate your state's health department, and many state websites also have listings of all of their local health departments.  3) Find a Walk-in Clinic If your illness is not likely to be very severe or complicated, you may want to try a walk in clinic. These are popping up all over the country in pharmacies, drugstores, and shopping centers. They're usually staffed by nurse practitioners or physician assistants that have been trained to treat common illnesses and complaints. They're usually fairly quick and inexpensive. However, if you have serious medical issues or chronic  medical problems, these are probably not your best option.  No Primary Care Doctor: - Call Health Connect at  [phone] - they can help you locate a primary care doctor that  accepts your insurance, provides certain services, etc. - Physician Referral Service- [phone]  Chronic Pain Problems: Organization         Address  Phone   Notes  [facility]  [phone] Patients need to be referred by their primary care doctor.   Medication Assistance: Organization         Address  Phone   Notes  [facility] Medication [facility] [address] [place] [place], [place] [address] [phone] --Must be a resident of [facility] -- Must have NO insurance coverage whatsoever (no Medicaid/ Medicare, etc.) -- The pt. MUST have a primary care doctor that directs their care regularly and follows them in the community   [facility]  [phone]   [employer]  [phone]    Agencies that provide inexpensive medical care: Organization         Address  Phone   Notes  [facility]  [phone]   [facility]    [phone]   [facility] [address] [place], [place] [address] [phone]   [facility] [address] [place] [place] [phone]   Planned Parenthood    [phone]   [facility]    [phone]   [facility]  [address], [place] Phone:  [phone], Fax:  [phone] Hours of Operation:  9 am - 6 pm, M-F.  Also accepts Medicaid/Medicare and self-pay.  [facility]  [address], [place] Phone: [phone], Fax: [phone]. Hours of Operation:  8:30 am - 5:30 pm, M-F.  Also accepts Medicaid and self-pay.  [facility] [place] [address] [employer] Phone: (  [phone]   [facility] [address] [place], [place] [phone],  Ext. [phone] Mondays & Thursdays: 7-9 AM.  First 15 patients are seen on a first come, first serve basis.    Medicaid-accepting [facility] Providers:  Organization         Address  Phone   Notes  [facility] [address], [place] [phone] Also accepts self-pay patients.  [facility] [address] Laurell Josephs [place], [place]  [phone]   [facility] [address], [place] [phone]   [facility] [address] [place] [phone]   Renaye Rakers [address], [place]   [phone] Only accepts [place] Access [place] patients after they have their name applied to their card.   Self-Pay (no insurance) in [facility]:  Organization         Address  Phone   Notes  Sickle Cell Patients, [facility] [address] [place], [place] [phone]   [facility] [address] [place], [place] [phone]   [facility] [place]  [address], [place] [phone]   [facility]/Dr. Osei-Bonsu  [address], [place] or [address], [place] [phone] Phone number for both [place] and [place] locations is the same.  [facility] [address], [place] [phone]   [facility] [address] [place] or [address] [phone]   [facility] [address] [place] [phone], phone; [phone], fax Sees patients 1st and 3rd Saturday of every month.  Must not qualify for public or private insurance (i.e. Medicaid, Medicare, [place] Health Choice, Veterans' Benefits)  Household income should be no more than 200% of the poverty level The clinic cannot treat you if you are pregnant or think you are pregnant  Sexually transmitted diseases are not  treated at the clinic.    Dental Care: Organization         Address  Phone  Notes  [facility] Department of [facility] [address] [place], [place] [phone] Accepts children up to age 39 who are enrolled in [place] or [place] Health Choice; pregnant women with a Medicaid card; and children who have applied for Medicaid or [place] Health Choice, but were declined, whose parents can pay a reduced fee at time of service.  [facility] Department of [facility]  [address], [place] [phone] Accepts children up to age 46 who are enrolled in [place] or [place] Health Choice; pregnant women with a Medicaid card; and children who have applied for Medicaid or  Health Choice, but were declined, whose parents can pay a reduced fee at time of service.  [facility]  [address] [place], [place] [phone] Patients are seen by appointment only. Walk-ins are not accepted. [facility] will see patients 46 years of age and older. Monday - Tuesday (8am-5pm) Most Wednesdays (8:30-5pm) $30 per visit, cash only  [facility]  [address], [facility] [phone] Patients are seen by appointment only. Walk-ins are not accepted. [facility] will see patients 58 years of age and older. One Wednesday Evening (Monthly: Volunteer Based).  $  30 per visit, cash only  [employer] of Dentistry Clinics  [phone] for adults; Children under age 58, call Graduate Pediatric Dentistry at [phone]. Children aged 29-14, please call [phone] to request a pediatric application.  Dental services are provided in all areas of dental care including fillings, crowns and bridges, complete and partial dentures, implants, gum treatment, root canals, and extractions. Preventive care is also provided. Treatment is provided to both adults and children. Patients are selected via a lottery and there is  often a waiting list.   [facility] [address] [place]  [phone] [email or web address]   [facility] [address] [place], [place] [phone], Ext. [phone] Second and Fourth Thursday of each month, opens at 6:30 AM; Clinic ends at 9 AM.  Patients are seen on a first-come first-served basis, and a limited number are seen during each clinic.   [facility]  [address] [place], [place] [phone]   Eligibility Requirements You must have lived in [place], [place], or [place] counties for at least the last three months.   You cannot be eligible for state or federal sponsored [place], including [employer], [place], or [employer].   You generally cannot be eligible for healthcare insurance through your employer.    How to apply: Eligibility screenings are held every Tuesday and Wednesday afternoon from 1:00 pm until 4:00 pm. You do not need an appointment for the interview!  [facility] [address], [place], [place] [phone]   [facility] Health Department  [phone]   [facility] Health Department  [phone]   [facility] Health Department  [phone]    Behavioral Health Resources in the Community: Intensive Outpatient Programs Organization         Address  Phone  Notes  [facility] [address], [place], [place] [phone]   [facility] [address], [place], [place] [phone]   [facility] [address], [place], [place]  [phone]   [facility] Mental Health [address]  [place], [place] [phone] or [phone]   Substance Abuse Resources Organization         Address  Phone  Notes  [facility]  [phone]   [facility]  [phone]   [facility]  [phone]   Floydene Flock  [phone]   Residential & Outpatient Substance  Abuse Program  [phone]   Psychological Services Organization         Address  Phone  Notes  [facility] Health  [phone]   [facility] Services  [phone]   [facility] Mental Health [address] [place] [phone] or [phone]    Mobile Crisis Teams Organization         Address  Phone  Notes  [facility], Mobile Crisis Care Unit  [phone]   [facility]  [address] [place], [place] [phone]   Doristine Locks [address] [place] [phone]    Self-Help/Support Groups Organization         Address  Phone             Notes  Mental Health Assoc. of  - variety of support groups  336- I7437963 Call for more information  Narcotics Anonymous (NA), [facility] [address], [employer] [place]  2 meetings at this location   Statistician  Address  Phone  Notes  [facility] [address],    [place]  [phone]   [facility]  [address], [place] [address], [place], [place] [phone]   [facility] [address] [place], [place] [phone] Admissions: 8am-3pm M-F  [facility] [address]    [place], [place] [phone]   [facility] [address] [place], [place], [place] [phone]   [facility] [address],  [place], [place] [phone]   [facility] - Intensive Outpatient [address]ure[address] Joseph[address], [place], [place] [phone]   [facility] ([facility] [address] [place]  [place], [place] [phone] or [phone]   [facility] ([facility]) [address], [place], [place] [phone] Accepts Medicaid  [facility] [place] [address],  [place] [phone] Substance Abuse/Addiction Treatment   [facility] Organization          Address  Phone  Notes  [facility]  9205704402   Angie Fava, PhD [address] Ervin Knack [place], [place]   [phone] or [phone]   [facility] Behavioral   [address] [place], [place] [phone]   [facility] [address] [place], [place] [phone] Insurance/Medicaid/sponsorship through [facility] [address]                                    [place], [place] [phone] Therapy/tele-psych/case  [facility] [address], [place] [phone]    Dr. Lolly Mustache  [phone]   [facility]  [employer] [facility] Dept. 1) [address] [place] 2) [address] [place] 3)  [address], [place] [phone]  [phone]   [facility] Child Abuse Hotline ([phone] or [phone] (After Hours)

## 2013-12-14 ENCOUNTER — Emergency Department (HOSPITAL_BASED_OUTPATIENT_CLINIC_OR_DEPARTMENT_OTHER): Payer: BC Managed Care – PPO

## 2013-12-14 ENCOUNTER — Emergency Department (HOSPITAL_BASED_OUTPATIENT_CLINIC_OR_DEPARTMENT_OTHER)
Admission: EM | Admit: 2013-12-14 | Discharge: 2013-12-14 | Disposition: A | Discharge status: Home / Self Care | Payer: BC Managed Care – PPO | Attending: Emergency Medicine | Admitting: Emergency Medicine

## 2013-12-14 ENCOUNTER — Encounter (HOSPITAL_BASED_OUTPATIENT_CLINIC_OR_DEPARTMENT_OTHER): Payer: Self-pay | Admitting: *Deleted

## 2013-12-14 DIAGNOSIS — Y9389 Activity, other specified: Secondary | ICD-10-CM | POA: Insufficient documentation

## 2013-12-14 DIAGNOSIS — Z720 Tobacco use: Secondary | ICD-10-CM | POA: Diagnosis not present

## 2013-12-14 DIAGNOSIS — Z791 Long term (current) use of non-steroidal anti-inflammatories (NSAID): Secondary | ICD-10-CM | POA: Diagnosis not present

## 2013-12-14 DIAGNOSIS — S99911A Unspecified injury of right ankle, initial encounter: Secondary | ICD-10-CM | POA: Diagnosis not present

## 2013-12-14 DIAGNOSIS — Z21 Asymptomatic human immunodeficiency virus [HIV] infection status: Secondary | ICD-10-CM | POA: Diagnosis not present

## 2013-12-14 DIAGNOSIS — Y998 Other external cause status: Secondary | ICD-10-CM | POA: Insufficient documentation

## 2013-12-14 DIAGNOSIS — W228XXA Striking against or struck by other objects, initial encounter: Secondary | ICD-10-CM | POA: Insufficient documentation

## 2013-12-14 DIAGNOSIS — Z79899 Other long term (current) drug therapy: Secondary | ICD-10-CM | POA: Insufficient documentation

## 2013-12-14 DIAGNOSIS — S99921A Unspecified injury of right foot, initial encounter: Secondary | ICD-10-CM | POA: Diagnosis present

## 2013-12-14 DIAGNOSIS — Y9289 Other specified places as the place of occurrence of the external cause: Secondary | ICD-10-CM | POA: Insufficient documentation

## 2013-12-14 DIAGNOSIS — T1490XA Injury, unspecified, initial encounter: Secondary | ICD-10-CM

## 2013-12-14 DIAGNOSIS — S9031XA Contusion of right foot, initial encounter: Secondary | ICD-10-CM | POA: Insufficient documentation

## 2013-12-14 MED ORDER — NAPROXEN 500 MG PO TABS: 500.0000 mg | ORAL_TABLET | Freq: Two times a day (BID) | ORAL | Status: DC

## 2013-12-14 NOTE — ED Notes (Signed)
Pt c/o right foot pain x 1 day

## 2013-12-14 NOTE — Discharge Instructions (Signed)
Please read and follow all provided instructions.  Your diagnoses today include:  1. Contusion of foot, right, initial encounter   2. Injury     Tests performed today include:  An x-ray of the affected area - does NOT show any broken bones  Vital signs. See below for your results today.   Medications prescribed:   Naproxen - anti-inflammatory pain medication  Do not exceed 500mg  naproxen every 12 hours, take with food  You have been prescribed an anti-inflammatory medication or NSAID. Take with food. Take smallest effective dose for the shortest duration needed for your pain. Stop taking if you experience stomach pain or vomiting.   Take any prescribed medications only as directed.  Home care instructions:   Follow any educational materials contained in this packet  Follow R.I.C.E. Protocol:  R - rest your injury   I  - use ice on injury without applying directly to skin  C - compress injury with bandage or splint  E - elevate the injury as much as possible  Follow-up instructions: Please follow-up with your primary care provider if you continue to have significant pain or trouble walking in 1 week. In this case you may have a severe injury that requires further care.   Return instructions:   Please return if your toes are numb or tingling, appear gray or blue, or you have severe pain (also elevate leg and loosen splint or wrap if you were given one)  Please return to the Emergency Department if you experience worsening symptoms.   Please return if you have any other emergent concerns.  Additional Information:  Your vital signs today were: BP 116/71 mmHg   Pulse 73   Temp(Src) 98.2 F (36.8 C) (Oral)   Resp 18   Ht 6' (1.829 m)   Wt 160 lb (72.576 kg)   BMI 21.70 kg/m2   SpO2 100% If your blood pressure (BP) was elevated above 135/85 this visit, please have this repeated by your doctor within one month. -------------- If prescribed crutches for your injury: use  crutches with non-weight bearing for the first few days. Then, you may walk as the pain allows, or as instructed. Start gradually with weight bearing on the affected side. Once you can walk pain free, then try jogging. When you can run forwards, then you can try moving side-to-side. If you cannot walk without crutches in one week, you need a re-check. --------------

## 2013-12-14 NOTE — ED Provider Notes (Signed)
CSN: [id number]     Arrival date & time 12/14/13  1253 History   First MD Initiated Contact with Patient 12/14/13 1254     Chief Complaint  Patient presents with  . Foot Pain     (Consider location/radiation/quality/duration/timing/severity/associated sxs/prior Treatment) HPI Comments: Patient presents with complaint of right foot swelling and pain starting acutely last night when he struck his right foot on a piece of furniture. Patient has been ambulatory but with pain. He has been elevating and applying ice. Patient took ibuprofen 1. No numbness or tingling. No knee or hip injuries. No back pain. Course is constant. Walking makes the pain worse.  Patient is a 23 y.o. male presenting with lower extremity pain. The history is provided by the patient.  Foot Pain Associated symptoms include arthralgias and joint swelling. Pertinent negatives include no neck pain, numbness or weakness.    Past Medical History  Diagnosis Date  . HIV disease    History reviewed. No pertinent past surgical history. History reviewed. No pertinent family history. History  Substance Use Topics  . Smoking status: Current Every Day Smoker -- 0.50 packs/day    Types: Cigarettes  . Smokeless tobacco: Never Used  . Alcohol Use: Yes     Comment: occasional    Review of Systems  Constitutional: Negative for activity change.  Musculoskeletal: Positive for joint swelling and arthralgias. Negative for back pain, gait problem and neck pain.  Skin: Negative for wound.  Neurological: Negative for weakness and numbness.    Allergies  Review of patient's allergies indicates no known allergies.  Home Medications   Prior to Admission medications   Medication Sig Start Date End Date Taking? Authorizing Provider  naproxen (NAPROSYN) 500 MG tablet Take 1 tablet (500 mg total) by mouth 2 (two) times daily. 12/14/13   Renne CriglerJoshua Ladeja Pelham, PA-C  traMADol (ULTRAM) 50 MG tablet Take 1 tablet (50 mg total) by mouth every 6  (six) hours as needed. 08/03/13   Rolan BuccoMelanie Belfi, MD   BP 116/71 mmHg  Pulse 73  Temp(Src) 98.2 F (36.8 C) (Oral)  Resp 18  Ht 6' (1.829 m)  Wt 160 lb (72.576 kg)  BMI 21.70 kg/m2  SpO2 100%   Physical Exam  Constitutional: He appears well-developed and well-nourished.  HENT:  Head: Normocephalic and atraumatic.  Eyes: Conjunctivae are normal.  Neck: Normal range of motion. Neck supple.  Cardiovascular: Normal pulses.   Pulses:      Dorsalis pedis pulses are 2+ on the right side, and 2+ on the left side.       Posterior tibial pulses are 2+ on the right side, and 2+ on the left side.  Musculoskeletal: He exhibits edema and tenderness.       Right knee: Normal.       Right ankle: He exhibits swelling. He exhibits normal range of motion. Tenderness.       Right lower leg: He exhibits no tenderness.       Right foot: There is tenderness, bony tenderness and swelling. There is normal range of motion.       Feet:  Neurological: He is alert. No sensory deficit.  Motor, sensation, and vascular distal to the injury is fully intact.   Skin: Skin is warm and dry.  Psychiatric: He has a normal mood and affect.  Nursing note and vitals reviewed.   ED Course  Procedures (including critical care time) Labs Review Labs Reviewed - No data to display  Imaging Review Dg Foot Complete Right  12/14/2013   CLINICAL DATA:  Right foot pain and tenderness, trauma  EXAM: RIGHT FOOT COMPLETE - 3+ VIEW  COMPARISON:  11/30/2011  FINDINGS: There is no evidence of fracture or dislocation. There is no evidence of arthropathy or other focal bone abnormality. Soft tissues are unremarkable.  IMPRESSION: Negative.   Electronically Signed   By: Christiana PellantGretchen  Green M.D.   On: 12/14/2013 13:45     EKG Interpretation None      2:03 PM Patient seen and examined.   Vital signs reviewed and are as follows: BP 116/71 mmHg  Pulse 73  Temp(Src) 98.2 F (36.8 C) (Oral)  Resp 18  Ht 6' (1.829 m)  Wt 160 lb  (72.576 kg)  BMI 21.70 kg/m2  SpO2 100%   X-ray neg. Pt informed.    MDM   Final diagnoses:  Contusion of foot, right, initial encounter   Patient with contusion of foot, x-ray neg. foot is neurovascularly intact. No knee pain or hip pain. Conservative measures indicated with PCP follow-up if not improved.    Renne CriglerJoshua Harjit Douds, PA-C 12/14/13 1410  Enid SkeensJoshua M Zavitz, MD 12/14/13 [phone]

## 2014-01-08 ENCOUNTER — Emergency Department (HOSPITAL_BASED_OUTPATIENT_CLINIC_OR_DEPARTMENT_OTHER)
Admission: EM | Admit: 2014-01-08 | Discharge: 2014-01-08 | Disposition: A | Discharge status: Home / Self Care | Payer: BC Managed Care – PPO | Attending: Emergency Medicine | Admitting: Emergency Medicine

## 2014-01-08 ENCOUNTER — Encounter (HOSPITAL_BASED_OUTPATIENT_CLINIC_OR_DEPARTMENT_OTHER): Payer: Self-pay | Admitting: *Deleted

## 2014-01-08 DIAGNOSIS — Z720 Tobacco use: Secondary | ICD-10-CM | POA: Diagnosis not present

## 2014-01-08 DIAGNOSIS — M79672 Pain in left foot: Secondary | ICD-10-CM

## 2014-01-08 DIAGNOSIS — G8911 Acute pain due to trauma: Secondary | ICD-10-CM | POA: Insufficient documentation

## 2014-01-08 DIAGNOSIS — M25571 Pain in right ankle and joints of right foot: Secondary | ICD-10-CM | POA: Insufficient documentation

## 2014-01-08 DIAGNOSIS — M79671 Pain in right foot: Secondary | ICD-10-CM

## 2014-01-08 DIAGNOSIS — Z21 Asymptomatic human immunodeficiency virus [HIV] infection status: Secondary | ICD-10-CM | POA: Diagnosis not present

## 2014-01-08 DIAGNOSIS — M25572 Pain in left ankle and joints of left foot: Secondary | ICD-10-CM | POA: Insufficient documentation

## 2014-01-08 MED ORDER — NAPROXEN 500 MG PO TABS: 500.0000 mg | ORAL_TABLET | Freq: Two times a day (BID) | ORAL | Status: DC

## 2014-01-08 NOTE — Discharge Instructions (Signed)
Return to the ED with any concerns including increased pain, swelling of foot or legs, redness of skin, fever/chills, decreased level of alertness/lethargy, or any other alarming symptoms

## 2014-01-08 NOTE — ED Provider Notes (Signed)
CSN: [id number]     Arrival date & time 01/08/14  [id number] History   First MD Initiated Contact with Patient 01/08/14 [phone]     Chief Complaint  Patient presents with  . Foot Pain     (Consider location/radiation/quality/duration/timing/severity/associated sxs/prior Treatment) HPI  Pt presents with c/o bilateral foot pain.  He states he dropped a heavy piece of furniture on top of his foot approx 4 weeks ago- xrays were negative.  He has continued to have pain in that foot on the top- but worse this morning.  No new injury.  He was at work and states he couldn't stand on his feet- also c/o pain in the top of his left foot.  States that has been going on for an even longer time period- no trauma to this foot.  No fever.  [facility], infectious disease.  He has tried ibuprofen without much relief.  There are no other associated systemic symptoms, there are no other alleviating or modifying factors.   Past Medical History  Diagnosis Date  . HIV disease    History reviewed. No pertinent past surgical history. History reviewed. No pertinent family history. History  Substance Use Topics  . Smoking status: Current Every Day Smoker -- 0.50 packs/day    Types: Cigarettes  . Smokeless tobacco: Never Used  . Alcohol Use: Yes     Comment: occasional    Review of Systems  ROS reviewed and all otherwise negative except for mentioned in HPI    Allergies  Review of patient's allergies indicates no known allergies.  Home Medications   Prior to Admission medications   Medication Sig Start Date End Date Taking? Authorizing Provider  naproxen (NAPROSYN) 500 MG tablet Take 1 tablet (500 mg total) by mouth 2 (two) times daily. 01/08/14   Ethelda ChickMartha K Linker, MD  naproxen (NAPROSYN) 500 MG tablet Take 1 tablet (500 mg total) by mouth 2 (two) times daily. 01/08/14   Ethelda ChickMartha K Linker, MD  traMADol (ULTRAM) 50 MG tablet Take 1 tablet (50 mg total) by mouth every 6 (six) hours  as needed. 08/03/13   Rolan BuccoMelanie Belfi, MD   BP 124/64 mmHg  Pulse 79  Temp(Src) 97.9 F (36.6 C) (Oral)  Resp 14  Ht 6' (1.829 m)  Wt 150 lb (68.04 kg)  BMI 20.34 kg/m2  SpO2 100%  Vitals reviewed Physical Exam  Physical Examination: General appearance - alert, well appearing, and in no distress Mental status - alert, oriented to person, place, and time Eyes - no conjunctival injection, no scleral icterus Chest - clear to auscultation, no wheezes, rales or rhonchi, symmetric air entry Neurological - alert, oriented x 3, cranial nerves grossly intact, stregnth 5/5 in bilateral lower extremities, sensation distally intact Musculoskeletal - no joint tenderness, deformity or swelling Extremities - peripheral pulses normal, no pedal edema, no clubbing or cyanosis Skin - normal coloration and turgor, no rashes  ED Course  Procedures (including critical care time) Labs Review Labs Reviewed - No data to display  Imaging Review No results found.   EKG Interpretation None      MDM   Final diagnoses:  Foot pain, bilateral    Pt presenting with bilateral foot pain both of which has been ongoing for several weeks.  No new truama.  Foot exams normal bialterally with both NVI.  Ad[facility] with podiatry [facility]  No signs of infectious process. Given rx for naproxen.  Discharged  with strict return precautions.  Pt agreeable with plan.    Ethelda ChickMartha K Linker, MD 01/08/14 [phone]

## 2014-01-08 NOTE — ED Notes (Signed)
Pt amb to room 10 with quick steady gait in nad. Pt reports dropping heavy tv on his foot, cont with pain to top of foot.

## 2014-03-09 ENCOUNTER — Encounter (HOSPITAL_BASED_OUTPATIENT_CLINIC_OR_DEPARTMENT_OTHER): Payer: Self-pay | Admitting: Emergency Medicine

## 2014-03-09 ENCOUNTER — Emergency Department (HOSPITAL_BASED_OUTPATIENT_CLINIC_OR_DEPARTMENT_OTHER): Payer: BLUE CROSS/BLUE SHIELD

## 2014-03-09 ENCOUNTER — Emergency Department (HOSPITAL_BASED_OUTPATIENT_CLINIC_OR_DEPARTMENT_OTHER)
Admission: EM | Admit: 2014-03-09 | Discharge: 2014-03-09 | Disposition: A | Discharge status: Home / Self Care | Payer: BLUE CROSS/BLUE SHIELD | Attending: Emergency Medicine | Admitting: Emergency Medicine

## 2014-03-09 DIAGNOSIS — Z720 Tobacco use: Secondary | ICD-10-CM | POA: Diagnosis not present

## 2014-03-09 DIAGNOSIS — Y9389 Activity, other specified: Secondary | ICD-10-CM | POA: Diagnosis not present

## 2014-03-09 DIAGNOSIS — Y998 Other external cause status: Secondary | ICD-10-CM | POA: Insufficient documentation

## 2014-03-09 DIAGNOSIS — W228XXA Striking against or struck by other objects, initial encounter: Secondary | ICD-10-CM | POA: Insufficient documentation

## 2014-03-09 DIAGNOSIS — Y9289 Other specified places as the place of occurrence of the external cause: Secondary | ICD-10-CM | POA: Insufficient documentation

## 2014-03-09 DIAGNOSIS — S6992XA Unspecified injury of left wrist, hand and finger(s), initial encounter: Secondary | ICD-10-CM | POA: Diagnosis present

## 2014-03-09 DIAGNOSIS — S56414A Strain of extensor muscle, fascia and tendon of left middle finger at forearm level, initial encounter: Secondary | ICD-10-CM | POA: Insufficient documentation

## 2014-03-09 DIAGNOSIS — Z21 Asymptomatic human immunodeficiency virus [HIV] infection status: Secondary | ICD-10-CM | POA: Diagnosis not present

## 2014-03-09 DIAGNOSIS — S66912A Strain of unspecified muscle, fascia and tendon at wrist and hand level, left hand, initial encounter: Secondary | ICD-10-CM

## 2014-03-09 DIAGNOSIS — IMO0001 Reserved for inherently not codable concepts without codable children: Secondary | ICD-10-CM

## 2014-03-09 NOTE — ED Notes (Signed)
24 yo c/o finger injury to left hand middle finger about a month ago. Painful and recent swelling. No swelling notated at triage. Pain 5/10

## 2014-03-09 NOTE — ED Provider Notes (Signed)
CSN: [id number]     Arrival date & time 03/09/14  1102 History   First MD Initiated Contact with Patient 03/09/14 1119     Chief Complaint  Patient presents with  . Finger Injury    left hand middle finger     (Consider location/radiation/quality/duration/timing/severity/associated sxs/prior Treatment) HPI Comments: Pt comes in with c/o left middle finger pain times 1 month. States that he hit it playing a game and he has continued to have pain and swelling to pip joint. Denies redness or warmth. Seemed more swollen today and that is why he cam in  The history is provided by the patient. No language interpreter was used.    Past Medical History  Diagnosis Date  . HIV disease    History reviewed. No pertinent past surgical history. No family history on file. History  Substance Use Topics  . Smoking status: Light Tobacco Smoker -- 0.50 packs/day    Types: Cigarettes  . Smokeless tobacco: Never Used  . Alcohol Use: Yes     Comment: occasional    Review of Systems  All other systems reviewed and are negative.     Allergies  Review of patient's allergies indicates no known allergies.  Home Medications   Prior to Admission medications   Medication Sig Start Date End Date Taking? Authorizing Provider  naproxen (NAPROSYN) 500 MG tablet Take 1 tablet (500 mg total) by mouth 2 (two) times daily. 01/08/14   Ethelda ChickMartha K Linker, MD  naproxen (NAPROSYN) 500 MG tablet Take 1 tablet (500 mg total) by mouth 2 (two) times daily. 01/08/14   Ethelda ChickMartha K Linker, MD  traMADol (ULTRAM) 50 MG tablet Take 1 tablet (50 mg total) by mouth every 6 (six) hours as needed. 08/03/13   Rolan BuccoMelanie Belfi, MD   BP 117/64 mmHg  Pulse 73  Temp(Src) 98.3 F (36.8 C)  Resp 16  Ht 6' (1.829 m)  Wt 150 lb (68.04 kg)  BMI 20.34 kg/m2  SpO2 97% Physical Exam  Constitutional: He is oriented to person, place, and time. He appears well-developed and well-nourished.  Cardiovascular: Normal rate and regular rhythm.    Pulmonary/Chest: Effort normal and breath sounds normal.  Musculoskeletal:  Swelling or pip of left middle finger. No redness or warmth to the area  Neurological: He is alert and oriented to person, place, and time. Coordination normal.  Skin: Skin is warm and dry.  Nursing note and vitals reviewed.   ED Course  Procedures (including critical care time) Labs Review Labs Reviewed - No data to display  Imaging Review Dg Finger Middle Left  03/09/2014   CLINICAL DATA:  Trauma left middle finger 1 month ago. Pain. Initial evaluation.  EXAM: LEFT MIDDLE FINGER 2+V  COMPARISON:  None.  FINDINGS: There is no evidence of fracture or dislocation. There is no evidence of arthropathy or other focal bone abnormality. Soft tissues are unremarkable.  IMPRESSION: Negative.   Electronically Signed   By: Maisie Fushomas  Register   On: 03/09/2014 12:28     EKG Interpretation None      MDM   Final diagnoses:  Finger strain, left, initial encounter    No acute fracture noted. Pt splinted for comfort and given follow up with Dr. Vivi Barrackhudnall    Carl Allnutt, NP 03/09/14 1235  [name], MD 03/09/14 1501

## 2014-03-09 NOTE — Discharge Instructions (Signed)
Finger Sprain °A finger sprain happens when the bands of tissue that hold the finger bones together (ligaments) stretch too much and tear. °HOME CARE °· Keep your injured finger raised (elevated) when possible. °· Put ice on the injured area, twice a day, for 2 to 3 days. °¨ Put ice in a plastic bag. °¨ Place a towel between your skin and the bag. °¨ Leave the ice on for 15 minutes. °· Only take medicine as told by your doctor. °· Do not wear rings on the injured finger. °· Protect your finger until pain and stiffness go away (usually 3 to 4 weeks). °· Do not get your cast or splint to get wet. Cover your cast or splint with a plastic bag when you shower or bathe. Do not swim. °· Your doctor may suggest special exercises for you to do. These exercises will help keep or stop stiffness from happening. °GET HELP RIGHT AWAY IF: °· Your cast or splint gets damaged. °· Your pain gets worse, not better. °MAKE SURE YOU: °· Understand these instructions. °· Will watch your condition. °· Will get help right away if you are not doing well or get worse. °Document Released: 01/28/2010 Document Revised: 03/20/2011 Document Reviewed: 08/29/2010 °ExitCare® Patient Information ©2015 [employer]. This information is not intended to replace advice given to you by your health care provider. Make sure you discuss any questions you have with your health care provider. ° °

## 2014-08-02 ENCOUNTER — Emergency Department (HOSPITAL_BASED_OUTPATIENT_CLINIC_OR_DEPARTMENT_OTHER)
Admission: EM | Admit: 2014-08-02 | Discharge: 2014-08-02 | Disposition: A | Discharge status: Home / Self Care | Payer: BLUE CROSS/BLUE SHIELD | Attending: Emergency Medicine | Admitting: Emergency Medicine

## 2014-08-02 ENCOUNTER — Encounter (HOSPITAL_BASED_OUTPATIENT_CLINIC_OR_DEPARTMENT_OTHER): Payer: Self-pay

## 2014-08-02 DIAGNOSIS — B029 Zoster without complications: Secondary | ICD-10-CM | POA: Insufficient documentation

## 2014-08-02 DIAGNOSIS — Z720 Tobacco use: Secondary | ICD-10-CM | POA: Diagnosis not present

## 2014-08-02 DIAGNOSIS — Z21 Asymptomatic human immunodeficiency virus [HIV] infection status: Secondary | ICD-10-CM | POA: Insufficient documentation

## 2014-08-02 DIAGNOSIS — Z791 Long term (current) use of non-steroidal anti-inflammatories (NSAID): Secondary | ICD-10-CM | POA: Diagnosis not present

## 2014-08-02 MED ORDER — PREDNISONE 10 MG PO TABS
60.0000 mg | ORAL_TABLET | Freq: Once | ORAL | Status: AC
  Administered 2014-08-02: 60 mg via ORAL
  Filled 2014-08-02 (×2): qty 1

## 2014-08-02 MED ORDER — VALACYCLOVIR HCL 1 G PO TABS: 1000.0000 mg | ORAL_TABLET | Freq: Three times a day (TID) | ORAL | Status: AC

## 2014-08-02 MED ORDER — HYDROCODONE-ACETAMINOPHEN 5-325 MG PO TABS: ORAL_TABLET | ORAL | Status: DC

## 2014-08-02 MED ORDER — HYDROCODONE-ACETAMINOPHEN 5-325 MG PO TABS
1.0000 | ORAL_TABLET | Freq: Once | ORAL | Status: AC
  Administered 2014-08-02: 1 via ORAL
  Filled 2014-08-02: qty 1

## 2014-08-02 MED ORDER — VALACYCLOVIR HCL 500 MG PO TABS
ORAL_TABLET | ORAL | Status: AC
  Filled 2014-08-02: qty 1

## 2014-08-02 MED ORDER — VALACYCLOVIR HCL 500 MG PO TABS
1000.0000 mg | ORAL_TABLET | Freq: Once | ORAL | Status: AC
  Administered 2014-08-02: 1000 mg via ORAL
  Filled 2014-08-02: qty 2

## 2014-08-02 MED ORDER — PREDNISONE 20 MG PO TABS: 60.0000 mg | ORAL_TABLET | Freq: Every day | ORAL | Status: DC

## 2014-08-02 NOTE — Discharge Instructions (Signed)
Please follow with your primary care doctor in the next 2 days for a check-up. They must obtain records for further management.   Do not hesitate to return to the Emergency Department for any new, worsening or concerning symptoms.    Shingles Shingles (herpes zoster) is an infection that is caused by the same virus that causes chickenpox (varicella). The infection causes a painful skin rash and fluid-filled blisters, which eventually break open, crust over, and heal. It may occur in any area of the body, but it usually affects only one side of the body or face. The pain of shingles usually lasts about 1 month. However, some people with shingles may develop long-term (chronic) pain in the affected area of the body. Shingles often occurs many years after the person had chickenpox. It is more common:  In people older than 50 years.  In people with weakened immune systems, such as those with HIV, AIDS, or cancer.  In people taking medicines that weaken the immune system, such as transplant medicines.  In people under great stress. CAUSES  Shingles is caused by the varicella zoster virus (VZV), which also causes chickenpox. After a person is infected with the virus, it can remain in the person's body for years in an inactive state (dormant). To cause shingles, the virus reactivates and breaks out as an infection in a nerve root. The virus can be spread from person to person (contagious) through contact with open blisters of the shingles rash. It will only spread to people who have not had chickenpox. When these people are exposed to the virus, they may develop chickenpox. They will not develop shingles. Once the blisters scab over, the person is no longer contagious and cannot spread the virus to others. SIGNS AND SYMPTOMS  Shingles shows up in stages. The initial symptoms may be pain, itching, and tingling in an area of the skin. This pain is usually described as burning, stabbing, or throbbing.In a  few days or weeks, a painful red rash will appear in the area where the pain, itching, and tingling were felt. The rash is usually on one side of the body in a band or belt-like pattern. Then, the rash usually turns into fluid-filled blisters. They will scab over and dry up in approximately 2-3 weeks. Flu-like symptoms may also occur with the initial symptoms, the rash, or the blisters. These may include:  Fever.  Chills.  Headache.  Upset stomach. DIAGNOSIS  Your health care provider will perform a skin exam to diagnose shingles. Skin scrapings or fluid samples may also be taken from the blisters. This sample will be examined under a microscope or sent to a lab for further testing. TREATMENT  There is no specific cure for shingles. Your health care provider will likely prescribe medicines to help you manage the pain, recover faster, and avoid long-term problems. This may include antiviral drugs, anti-inflammatory drugs, and pain medicines. HOME CARE INSTRUCTIONS   Take a cool bath or apply cool compresses to the area of the rash or blisters as directed. This may help with the pain and itching.   Take medicines only as directed by your health care provider.   Rest as directed by your health care provider.  Keep your rash and blisters clean with mild soap and cool water or as directed by your health care provider.  Do not pick your blisters or scratch your rash. Apply an anti-itch cream or numbing creams to the affected area as directed by your health care  provider. °· Keep your shingles rash covered with a loose bandage (dressing). °· Avoid skin contact with: °¨ Babies.   °¨ Pregnant women.   °¨ Children with eczema.   °¨ Elderly people with transplants.   °¨ People with chronic illnesses, such as leukemia or AIDS.   °· Wear loose-fitting clothing to help ease the pain of material rubbing against the rash. °· Keep all follow-up visits as directed by your health care provider. If the area  involved is on your face, you may receive a referral for a specialist, such as an eye doctor (ophthalmologist) or an ear, nose, and throat (ENT) doctor. Keeping all follow-up visits will help you avoid eye problems, chronic pain, or disability.   °SEEK IMMEDIATE MEDICAL CARE IF:  °· You have facial pain, pain around the eye area, or loss of feeling on one side of your face. °· You have ear pain or ringing in your ear. °· You have loss of taste. °· Your pain is not relieved with prescribed medicines.   °· Your redness or swelling spreads.   °· You have more pain and swelling.  °· Your condition is worsening or has changed.   °· You have a fever. °MAKE SURE YOU: °· Understand these instructions. °· Will watch your condition. °· Will get help right away if you are not doing well or get worse. °Document Released: 12/26/2004 Document Revised: 05/12/2013 Document Reviewed: 08/10/2011 °ExitCare® Patient Information ©2015 [employer]. This information is not intended to replace advice given to you by your health care provider. Make sure you discuss any questions you have with your health care provider. ° °

## 2014-08-02 NOTE — ED Notes (Signed)
Pt reports vesicular rash to left side of chest area x2 days - pt reports itching, very painful - pt states he had shingles a few years ago.

## 2014-08-03 NOTE — ED Provider Notes (Signed)
CSN: [id number]     Arrival date & time 08/02/14  2144 History   First MD Initiated Contact with Patient 08/02/14 2159     Chief Complaint  Patient presents with  . Herpes Zoster     (Consider location/radiation/quality/duration/timing/severity/associated sxs/prior Treatment) HPI   Blood pressure 120/75, pulse 77, temperature 98.9 F (37.2 C), temperature source Oral, resp. rate 18, height  (1.854 m), weight 154 lb (69.854 kg), SpO2 100 %.  Carl Bradley is a 24 y.o. male past medical history significant for HIV, last CD4 count is unknown. Patient states that he is compliant with his anti-retroviral remnant medication states he follows in [place]. He is complaining of a painful and pruritic rash to chest on the left side exclusively which she noticed 2 days ago. Patient denies fever, chills, nausea, vomiting and states he had shingles a few years ago and states this feels similar.  Past Medical History  Diagnosis Date  . HIV disease    History reviewed. No pertinent past surgical history. History reviewed. No pertinent family history. History  Substance Use Topics  . Smoking status: Light Tobacco Smoker -- 0.50 packs/day    Types: Cigarettes  . Smokeless tobacco: Never Used  . Alcohol Use: Yes     Comment: occasional    Review of Systems  10 systems reviewed and found to be negative, except as noted in the HPI.   Allergies  Review of patient's allergies indicates no known allergies.  Home Medications   Prior to Admission medications   Medication Sig Start Date End Date Taking? Authorizing Provider  HYDROcodone-acetaminophen (NORCO/VICODIN) 5-325 MG per tablet Take 1-2 tablets by mouth every 6 hours as needed for pain and/or cough. 08/02/14   Mithra Spano, PA-C  naproxen (NAPROSYN) 500 MG tablet Take 1 tablet (500 mg total) by mouth 2 (two) times daily. 01/08/14   Jerelyn Scott, MD  naproxen (NAPROSYN) 500 MG tablet Take 1 tablet (500 mg total) by mouth 2 (two)  times daily. 01/08/14   Jerelyn Scott, MD  predniSONE (DELTASONE) 20 MG tablet Take 3 tablets (60 mg total) by mouth daily. Take 60 mg by mouth daily week 1, then  by mouth daily for week 2, then  daily for week 3 08/02/14   Joni Reining Ubaldo Daywalt, PA-C  traMADol (ULTRAM) 50 MG tablet Take 1 tablet (50 mg total) by mouth every 6 (six) hours as needed. 08/03/13   Rolan Bucco, MD  valACYclovir (VALTREX) 1000 MG tablet Take 1 tablet (1,000 mg total) by mouth 3 (three) times daily. 08/02/14 08/09/14  Ramar Nobrega, PA-C   BP 120/75 mmHg  Pulse 77  Temp(Src) 98.9 F (37.2 C) (Oral)  Resp 18  Ht  (1.854 m)  Wt 154 lb (69.854 kg)  BMI 20.32 kg/m2  SpO2 100% Physical Exam  Constitutional: He is oriented to person, place, and time. He appears well-developed and well-nourished. No distress.  HENT:  Head: Normocephalic and atraumatic.  Mouth/Throat: Oropharynx is clear and moist.  Eyes: Conjunctivae and EOM are normal. Pupils are equal, round, and reactive to light.  Neck: Normal range of motion.  Cardiovascular: Normal rate, regular rhythm and intact distal pulses.   Pulmonary/Chest: Effort normal and breath sounds normal.  Abdominal: Soft. There is no tenderness.  Musculoskeletal: Normal range of motion.  Neurological: He is alert and oriented to person, place, and time.  Skin: Rash noted. He is not diaphoretic.     Vesicular lesions as diagrammed, no significant surrounding warmth or induration.  Psychiatric:  He has a normal mood and affect.  Nursing note and vitals reviewed.   ED Course  Procedures (including critical care time) Labs Review Labs Reviewed - No data to display  Imaging Review No results found.   EKG Interpretation None      MDM   Final diagnoses:  Herpes zoster    Filed Vitals:   08/02/14 2156  BP: 120/75  Pulse: 77  Temp: 98.9 F (37.2 C)  TempSrc: Oral  Resp: 18  Height: 6\' 1"  (1.854 m)  Weight: 154 lb (69.854 kg)  SpO2: 100%     Medications  valACYclovir (VALTREX) tablet 1,000 mg (1,000 mg Oral Given 08/02/14 2210)  predniSONE (DELTASONE) tablet 60 mg (60 mg Oral Given 08/02/14 2210)  HYDROcodone-acetaminophen (NORCO/VICODIN) 5-325 MG per tablet 1 tablet (1 tablet Oral Given 08/02/14 2210)  valACYclovir (VALTREX) 500 MG tablet (  Duplicate 08/02/14 2226)    Carl Bradley is a pleasant 24 y.o. male presenting with Rash consistent with herpes zoster. Patient has HIV and he states he's compliant with his medications, he has a primary care in [place] he states that his infectious disease doctor is his PCP. I will start him on zoster treatment but have encouraged him to follow closely with his infectious disease doctor. Patient verbalizes understanding.  Evaluation does not show pathology that would require ongoing emergent intervention or inpatient treatment. Pt is hemodynamically stable and mentating appropriately. Discussed findings and plan with patient/guardian, who agrees with care plan. All questions answered. Return precautions discussed and outpatient follow up given.   Discharge Medication List as of 08/02/2014 10:08 PM    START taking these medications   Details  HYDROcodone-acetaminophen (NORCO/VICODIN) 5-325 MG per tablet Take 1-2 tablets by mouth every 6 hours as needed for pain and/or cough., Print    predniSONE (DELTASONE) 20 MG tablet Take 3 tablets (60 mg total) by mouth daily. Take 60 mg by mouth daily week 1, then 40mg  by mouth daily for week 2, then 20mg  daily for week 3, Starting 08/02/2014, Until Discontinued, Print    valACYclovir (VALTREX) 1000 MG tablet Take 1 tablet (1,000 mg total) by mouth 3 (three) times daily., Starting 08/02/2014, Until Sun 08/09/14, Print             Alexandre Lightsey, PA-C 08/03/14 0104  Jerelyn Scott, MD 08/04/14 [phone]

## 2014-12-06 ENCOUNTER — Emergency Department (HOSPITAL_BASED_OUTPATIENT_CLINIC_OR_DEPARTMENT_OTHER): Payer: BLUE CROSS/BLUE SHIELD

## 2014-12-06 ENCOUNTER — Emergency Department (HOSPITAL_BASED_OUTPATIENT_CLINIC_OR_DEPARTMENT_OTHER)
Admission: EM | Admit: 2014-12-06 | Discharge: 2014-12-06 | Disposition: A | Discharge status: Home / Self Care | Payer: BLUE CROSS/BLUE SHIELD | Attending: Emergency Medicine | Admitting: Emergency Medicine

## 2014-12-06 ENCOUNTER — Encounter (HOSPITAL_BASED_OUTPATIENT_CLINIC_OR_DEPARTMENT_OTHER): Payer: Self-pay | Admitting: *Deleted

## 2014-12-06 DIAGNOSIS — Z791 Long term (current) use of non-steroidal anti-inflammatories (NSAID): Secondary | ICD-10-CM | POA: Insufficient documentation

## 2014-12-06 DIAGNOSIS — W108XXA Fall (on) (from) other stairs and steps, initial encounter: Secondary | ICD-10-CM | POA: Diagnosis not present

## 2014-12-06 DIAGNOSIS — S90511A Abrasion, right ankle, initial encounter: Secondary | ICD-10-CM

## 2014-12-06 DIAGNOSIS — F17210 Nicotine dependence, cigarettes, uncomplicated: Secondary | ICD-10-CM | POA: Insufficient documentation

## 2014-12-06 DIAGNOSIS — S99921A Unspecified injury of right foot, initial encounter: Secondary | ICD-10-CM | POA: Diagnosis present

## 2014-12-06 DIAGNOSIS — Y9289 Other specified places as the place of occurrence of the external cause: Secondary | ICD-10-CM | POA: Diagnosis not present

## 2014-12-06 DIAGNOSIS — Z7952 Long term (current) use of systemic steroids: Secondary | ICD-10-CM | POA: Insufficient documentation

## 2014-12-06 DIAGNOSIS — S90811A Abrasion, right foot, initial encounter: Secondary | ICD-10-CM | POA: Diagnosis not present

## 2014-12-06 DIAGNOSIS — Z21 Asymptomatic human immunodeficiency virus [HIV] infection status: Secondary | ICD-10-CM | POA: Diagnosis not present

## 2014-12-06 DIAGNOSIS — Y9302 Activity, running: Secondary | ICD-10-CM | POA: Insufficient documentation

## 2014-12-06 DIAGNOSIS — Y998 Other external cause status: Secondary | ICD-10-CM | POA: Diagnosis not present

## 2014-12-06 MED ORDER — CEPHALEXIN 250 MG PO CAPS
1000.0000 mg | ORAL_CAPSULE | Freq: Once | ORAL | Status: AC
  Administered 2014-12-06: 1000 mg via ORAL
  Filled 2014-12-06: qty 4

## 2014-12-06 MED ORDER — TETANUS-DIPHTH-ACELL PERTUSSIS 5-2.5-18.5 LF-MCG/0.5 IM SUSP: 0.5000 mL | Freq: Once | INTRAMUSCULAR | Status: DC

## 2014-12-06 MED ORDER — CEPHALEXIN 500 MG PO CAPS: 500.0000 mg | ORAL_CAPSULE | Freq: Four times a day (QID) | ORAL | Status: DC

## 2014-12-06 MED ORDER — HYDROCODONE-ACETAMINOPHEN 5-325 MG PO TABS: 1.0000 | ORAL_TABLET | Freq: Four times a day (QID) | ORAL | Status: DC | PRN

## 2014-12-06 NOTE — ED Provider Notes (Addendum)
CSN: [id number]     Arrival date & time 12/06/14  0242 History   First MD Initiated Contact with Patient 12/06/14 0304     Chief Complaint  Patient presents with  . Foot Injury     (Consider location/radiation/quality/duration/timing/severity/associated sxs/prior Treatment) HPI  This is a 24 year old male who states he was running down the stairs chasing his sister and fell. He injured his right foot. He has multiple deep abrasions to the medial aspect of his right foot and ankle. He states that his pain is a 10 out of 10, worse with movement. He does not remember what time this happened, but it was sometime overnight. He denies any associated functional or sensory deficit. He denies any other injury. He admits to drinking alcohol earlier.  Past Medical History  Diagnosis Date  . HIV disease (HCC)    History reviewed. No pertinent past surgical history. No family history on file. Social History  Substance Use Topics  . Smoking status: Light Tobacco Smoker -- 0.50 packs/day    Types: Cigarettes  . Smokeless tobacco: Never Used  . Alcohol Use: Yes     Comment: occasional    Review of Systems  All other systems reviewed and are negative.   Allergies  Review of patient's allergies indicates no known allergies.  Home Medications   Prior to Admission medications   Medication Sig Start Date End Date Taking? Authorizing Provider  HYDROcodone-acetaminophen (NORCO/VICODIN) 5-325 MG per tablet Take 1-2 tablets by mouth every 6 hours as needed for pain and/or cough. 08/02/14   Nicole Pisciotta, PA-C  naproxen (NAPROSYN) 500 MG tablet Take 1 tablet (500 mg total) by mouth 2 (two) times daily. 01/08/14   Jerelyn ScottMartha Linker, MD  naproxen (NAPROSYN) 500 MG tablet Take 1 tablet (500 mg total) by mouth 2 (two) times daily. 01/08/14   Jerelyn ScottMartha Linker, MD  predniSONE (DELTASONE) 20 MG tablet Take 3 tablets (60 mg total) by mouth daily. Take 60 mg by mouth daily week 1, then 40mg  by mouth daily for  week 2, then 20mg  daily for week 3 08/02/14   Joni ReiningNicole Pisciotta, PA-C  traMADol (ULTRAM) 50 MG tablet Take 1 tablet (50 mg total) by mouth every 6 (six) hours as needed. 08/03/13   Rolan BuccoMelanie Belfi, MD   BP 126/79 mmHg  Pulse 85  Temp(Src) 98 F (36.7 C) (Oral)  Resp 18  Ht 6' (1.829 m)  Wt 150 lb (68.04 kg)  BMI 20.34 kg/m2  SpO2 98%   Physical Exam  General: Well-developed, well-nourished male in no acute distress; appearance consistent with age of record; appears comfortable, playing with smart phone HENT: normocephalic; atraumatic Eyes: Normal appearance Neck: supple Heart: regular rate and rhythm Lungs: Normal respiratory effort and excursion Abdomen: soft; nondistended; nontender; no masses or hepatosplenomegaly; bowel sounds present Extremities: No deformity; full range of motion; pulses normal; left foot injuries as noted below, motor function intact in right ankle and toes, right foot distally neurovascularly intact Neurologic: Awake, alert; motor function intact in all extremities and symmetric; no facial droop Skin: Warm and dry; deep abrasions to medial right foot and ankle:      Psychiatric: Normal mood and affect    ED Course  Procedures (including critical care time)   MDM  Nursing notes and vitals signs, including pulse oximetry, reviewed.  Summary of this visit's results, reviewed by myself:  Imaging Studies: Dg Ankle Complete Right  12/06/2014  CLINICAL DATA:  Status post fall down 5 stairs, with right inner foot abrasions.  Initial encounter. EXAM: RIGHT ANKLE - COMPLETE 3+ VIEW COMPARISON:  Right foot radiographs performed 12/14/2013 FINDINGS: There is no evidence of fracture or dislocation. The ankle mortise is intact; the interosseous space is within normal limits. No talar tilt or subluxation is seen. The joint spaces are preserved. An apparent soft tissue defect is noted overlying the medial malleolus. IMPRESSION: No evidence of fracture or dislocation.  Electronically Signed   By: Roanna Raider M.D.   On: 12/06/2014 03:47   Dg Foot Complete Right  12/06/2014  CLINICAL DATA:  Status post fall down 5 stairs, with large open abrasion at the right inner foot. Initial encounter. EXAM: RIGHT FOOT COMPLETE - 3+ VIEW COMPARISON:  Right foot radiographs performed 12/14/2013 FINDINGS: There is no evidence of fracture or dislocation. The joint spaces are preserved. There is no evidence of talar subluxation; the subtalar joint is unremarkable in appearance. The known soft tissue abrasion is not well characterized on radiograph. No radiopaque foreign bodies are seen. IMPRESSION: No evidence of fracture or dislocation. Electronically Signed   By: Roanna Raider M.D.   On: 12/06/2014 03:45   3:52 AM We'll address these deep abrasions with Xeroform gauze and placed in a postop shoe. Will refer to sports medicine for wound check follow-up. We will place him on antibiotics for infection prophylaxis. None of the wounds appear amenable to primary closure as a represent deep abrasions with potential for contamination and indeterminant time since injury occurred.    Paula Libra, MD 12/06/14 8295  Paula Libra, MD 12/06/14 [phone]

## 2014-12-06 NOTE — ED Notes (Signed)
Returned from xray

## 2014-12-06 NOTE — ED Notes (Signed)
Pt to xray

## 2014-12-06 NOTE — ED Notes (Signed)
MD with pt  

## 2014-12-06 NOTE — ED Notes (Signed)
Pt states he was running down the stairs and fell down 5 stairs. Denies any injury other than his right foot. Pt. Has an large open abrasion to right inner foot with 4 circular areas noted on his right inner foot.  3 are dime size and 1 a quarter size. Pt. Is unsure what caused the multiple circular open areas to his foot. Pt states he does not know what time this happened. Pt states he has been drinking etoh this evening.

## 2014-12-23 ENCOUNTER — Encounter (HOSPITAL_BASED_OUTPATIENT_CLINIC_OR_DEPARTMENT_OTHER): Payer: Self-pay | Admitting: *Deleted

## 2014-12-23 ENCOUNTER — Emergency Department (HOSPITAL_BASED_OUTPATIENT_CLINIC_OR_DEPARTMENT_OTHER)
Admission: EM | Admit: 2014-12-23 | Discharge: 2014-12-24 | Disposition: A | Discharge status: Home / Self Care | Payer: BLUE CROSS/BLUE SHIELD | Attending: Physician Assistant | Admitting: Physician Assistant

## 2014-12-23 DIAGNOSIS — F17210 Nicotine dependence, cigarettes, uncomplicated: Secondary | ICD-10-CM | POA: Insufficient documentation

## 2014-12-23 DIAGNOSIS — R200 Anesthesia of skin: Secondary | ICD-10-CM | POA: Diagnosis not present

## 2014-12-23 DIAGNOSIS — Z21 Asymptomatic human immunodeficiency virus [HIV] infection status: Secondary | ICD-10-CM | POA: Insufficient documentation

## 2014-12-23 DIAGNOSIS — M791 Myalgia: Secondary | ICD-10-CM | POA: Insufficient documentation

## 2014-12-23 DIAGNOSIS — Z4800 Encounter for change or removal of nonsurgical wound dressing: Secondary | ICD-10-CM | POA: Diagnosis present

## 2014-12-23 DIAGNOSIS — Z5189 Encounter for other specified aftercare: Secondary | ICD-10-CM

## 2014-12-23 NOTE — ED Notes (Signed)
Pt has three areas that were abrasions on 11/27 and are now open wounds that are in various stages of healing.  Pt did not follow up with wound care and has been using hydrogen peroxide to clean his wounds.  He has not been keeping the wounds covered and is wearing sandals that are very dirty.  He states he was using bacitracin but stopped "because it made the scabs soft."  Reeducated pt to the healing process and appropriate wound care; pt was on his phone.  No significant redness or swelling to area appreciated and pt denies fevers.

## 2014-12-23 NOTE — Discharge Instructions (Signed)
Please take care of the wound as we discussed. You need to follow up with her primary care physician. Was also given you the number of the wound care clinic to call.

## 2014-12-23 NOTE — ED Provider Notes (Signed)
CSN: [id number]     Arrival date & time 12/23/14  2225 History  By signing my name below, I, Budd Palmer, attest that this documentation has been prepared under the direction and in the presence of Keonna Raether Randall An, MD. Electronically Signed: Budd Palmer, ED Scribe. 12/23/2014. 11:19 PM.    Chief Complaint  Patient presents with  . Ankle Pain   The history is provided by the patient. No language interpreter was used.   HPI Comments: Carl Bradley is a 24 y.o. male occasional smoker who presents to the Emergency Department complaining of constant, aching, unchanged right ankle pain onset 11/27. Pt also states his right great toe intermittently grows numb and states he is unable to bend it. He notes he was seen in the ED on 11/27 for abrasions to the right foot. He states he has taken all of the prescribed antibiotics. He reports he has also been using peroxide and epsom salt soaks. He notes he did not f/u with anyone after being seen in the ED. He denies a PMHx of DM. Pt denies fever.   Past Medical History  Diagnosis Date  . HIV disease (HCC)    History reviewed. No pertinent past surgical history. History reviewed. No pertinent family history. Social History  Substance Use Topics  . Smoking status: Light Tobacco Smoker -- 0.50 packs/day    Types: Cigarettes  . Smokeless tobacco: Never Used  . Alcohol Use: Yes     Comment: occasional    Review of Systems  Constitutional: Negative for fever.  Musculoskeletal: Positive for myalgias and arthralgias.  Skin: Positive for wound.  Neurological: Positive for numbness.  All other systems reviewed and are negative.   Allergies  Review of patient's allergies indicates no known allergies.  Home Medications   Prior to Admission medications   Medication Sig Start Date End Date Taking? Authorizing Provider  HYDROcodone-acetaminophen (NORCO) 5-325 MG tablet Take 1-2 tablets by mouth every 6 (six) hours as needed (for pain).  12/06/14   John Molpus, MD   BP 123/80 mmHg  Pulse 78  Temp(Src) 98.2 F (36.8 C) (Oral)  Resp 16  Ht  (1.854 m)  Wt 150 lb (68.04 kg)  BMI 19.79 kg/m2  SpO2 100% Physical Exam  Constitutional: He is oriented to person, place, and time. He appears well-developed and well-nourished.  HENT:  Head: Normocephalic and atraumatic.  Eyes: Conjunctivae and EOM are normal. Right eye exhibits no discharge. Left eye exhibits no discharge.  Cardiovascular: Normal rate, regular rhythm and normal heart sounds.   Pulmonary/Chest: Effort normal and breath sounds normal. No respiratory distress.  Abdominal: Soft. He exhibits no distension.  Musculoskeletal: Normal range of motion. He exhibits tenderness.  FROM oof R foot, One 2x2 cm crusted wound over the medial malleolus, one 5x2 cm crusted lesion within the mid-foot, and one 1 cm round crusted lesion over the first MCP joint  Neurological: He is alert and oriented to person, place, and time. Coordination normal.  Skin: Skin is warm and dry. No rash noted. He is not diaphoretic. No erythema.  Psychiatric: He has a normal mood and affect.  Nursing note and vitals reviewed.   ED Course  Procedures  DIAGNOSTIC STUDIES: Oxygen Saturation is 100% on RA, normal by my interpretation.    COORDINATION OF CARE: 11:11 PM - Discussed plans to discharge. Advised to use warm soaks and apply antibiotic ointment afterwards to soften the scabs. Advised to stop peroxide treatments. Pt advised of plan for treatment and  pt agrees.  Labs Review Labs Reviewed - No data to display  Imaging Review No results found. I have personally reviewed and evaluated these images and lab results as part of my medical decision-making.   EKG Interpretation None      MDM   Final diagnoses:  None    Patient is a 24 yo male with chronic non healing foot wound. Patient has not been takign care of it, and has not followed up as told. The foot shows crusted, lesions.  Told patietn to use warm soaks, bacitracin/neosporin and tehn xeroform with gauze wraps. No active infection.  We did wound care as described above and gave him plenty of materials to take care at home as well as follow up.    I personally performed the services described in this documentation, which was scribed in my presence. The recorded information has been reviewed and is accurate.     Janiesha Diehl Randall AnLyn Lynnlee Revels, MD 12/23/14 [phone]

## 2014-12-23 NOTE — ED Notes (Addendum)
Pt c/o cont pain to right foot and ankle, seen here 11/27 did not f/u with ortho several open wounds noted

## 2015-01-21 ENCOUNTER — Emergency Department (HOSPITAL_BASED_OUTPATIENT_CLINIC_OR_DEPARTMENT_OTHER)
Admission: EM | Admit: 2015-01-21 | Discharge: 2015-01-21 | Disposition: A | Discharge status: Home / Self Care | Payer: BLUE CROSS/BLUE SHIELD | Attending: Emergency Medicine | Admitting: Emergency Medicine

## 2015-01-21 ENCOUNTER — Encounter (HOSPITAL_BASED_OUTPATIENT_CLINIC_OR_DEPARTMENT_OTHER): Payer: Self-pay | Admitting: *Deleted

## 2015-01-21 ENCOUNTER — Emergency Department (HOSPITAL_BASED_OUTPATIENT_CLINIC_OR_DEPARTMENT_OTHER): Payer: BLUE CROSS/BLUE SHIELD

## 2015-01-21 DIAGNOSIS — Z21 Asymptomatic human immunodeficiency virus [HIV] infection status: Secondary | ICD-10-CM | POA: Insufficient documentation

## 2015-01-21 DIAGNOSIS — F17210 Nicotine dependence, cigarettes, uncomplicated: Secondary | ICD-10-CM | POA: Insufficient documentation

## 2015-01-21 DIAGNOSIS — J159 Unspecified bacterial pneumonia: Secondary | ICD-10-CM | POA: Insufficient documentation

## 2015-01-21 DIAGNOSIS — R000 Tachycardia, unspecified: Secondary | ICD-10-CM | POA: Insufficient documentation

## 2015-01-21 DIAGNOSIS — M791 Myalgia: Secondary | ICD-10-CM | POA: Diagnosis not present

## 2015-01-21 DIAGNOSIS — R509 Fever, unspecified: Secondary | ICD-10-CM | POA: Diagnosis present

## 2015-01-21 DIAGNOSIS — J189 Pneumonia, unspecified organism: Secondary | ICD-10-CM

## 2015-01-21 LAB — CBC WITH DIFFERENTIAL/PLATELET
Basophils Absolute: 0 10*3/uL (ref 0.0–0.1)
Basophils Relative: 0 %
EOS PCT: 4 %
Eosinophils Absolute: 0.4 10*3/uL (ref 0.0–0.7)
HEMATOCRIT: 36.9 % — AB (ref 39.0–52.0)
Hemoglobin: 13.5 g/dL (ref 13.0–17.0)
LYMPHS PCT: 14 %
Lymphs Abs: 1.2 10*3/uL (ref 0.7–4.0)
MCH: 29.9 pg (ref 26.0–34.0)
MCHC: 36.6 g/dL — AB (ref 30.0–36.0)
MCV: 81.6 fL (ref 78.0–100.0)
MONO ABS: 0.8 10*3/uL (ref 0.1–1.0)
MONOS PCT: 9 %
NEUTROS ABS: 6.1 10*3/uL (ref 1.7–7.7)
Neutrophils Relative %: 72 %
PLATELETS: 293 10*3/uL (ref 150–400)
RBC: 4.52 MIL/uL (ref 4.22–5.81)
RDW: 13.1 % (ref 11.5–15.5)
WBC: 8.6 10*3/uL (ref 4.0–10.5)

## 2015-01-21 LAB — BASIC METABOLIC PANEL
ANION GAP: 5 (ref 5–15)
BUN: 8 mg/dL (ref 6–20)
CALCIUM: 8.3 mg/dL — AB (ref 8.9–10.3)
CO2: 28 mmol/L (ref 22–32)
Chloride: 104 mmol/L (ref 101–111)
Creatinine, Ser: 1.05 mg/dL (ref 0.61–1.24)
GFR calc Af Amer: >60 mL/min (ref >60)
GFR calc non Af Amer: >60 mL/min (ref >60)
GLUCOSE: 81 mg/dL (ref 65–99)
POTASSIUM: 3.8 mmol/L (ref 3.5–5.1)
Sodium: 137 mmol/L (ref 135–145)

## 2015-01-21 MED ORDER — SULFAMETHOXAZOLE-TRIMETHOPRIM 800-160 MG PO TABS
1.0000 | ORAL_TABLET | Freq: Once | ORAL | Status: AC
  Administered 2015-01-21: 1 via ORAL
  Filled 2015-01-21: qty 1

## 2015-01-21 MED ORDER — LEVOFLOXACIN IN D5W 500 MG/100ML IV SOLN
500.0000 mg | Freq: Once | INTRAVENOUS | Status: AC
  Administered 2015-01-21: 500 mg via INTRAVENOUS
  Filled 2015-01-21: qty 100

## 2015-01-21 MED ORDER — LEVOFLOXACIN 500 MG PO TABS: 500.0000 mg | ORAL_TABLET | Freq: Every day | ORAL | Status: DC

## 2015-01-21 MED ORDER — SODIUM CHLORIDE 0.9 % IV BOLUS (SEPSIS)
1000.0000 mL | Freq: Once | INTRAVENOUS | Status: AC
  Administered 2015-01-21: 1000 mL via INTRAVENOUS

## 2015-01-21 MED ORDER — KETOROLAC TROMETHAMINE 30 MG/ML IJ SOLN
30.0000 mg | Freq: Once | INTRAMUSCULAR | Status: AC
  Administered 2015-01-21: 30 mg via INTRAVENOUS
  Filled 2015-01-21: qty 1

## 2015-01-21 MED ORDER — SULFAMETHOXAZOLE-TRIMETHOPRIM 800-160 MG PO TABS: 1.0000 | ORAL_TABLET | Freq: Every day | ORAL | Status: AC

## 2015-01-21 MED ORDER — ACETAMINOPHEN 325 MG PO TABS
650.0000 mg | ORAL_TABLET | Freq: Once | ORAL | Status: AC
  Administered 2015-01-21: 650 mg via ORAL
  Filled 2015-01-21: qty 2

## 2015-01-21 NOTE — ED Provider Notes (Signed)
CSN: [id number]     Arrival date & time 01/21/15  1405 History   First MD Initiated Contact with Patient 01/21/15 1423     Chief Complaint  Patient presents with  . Fever    HPI  Carl Bradley is an 25 y.o. male with history of HIV (last CD4 unknown) who presents to the ED for evaluation of myalgias, nasal congestion, chills, and cough. He states his symptoms started "a few days ago." He states he started with what seemed like a cold with runny nose, stuffy nose, and cough. He states the cough is productive of clear phlegm. He states that for the past two days he has had increasingly worsening diffuse myalgias. He states that he hurts all over his body. He endorses associated intermittent nausea but denies emesis. Denies diarrhea. Denies sick contacts. He is unsure if he has had a fever but does endorse chills. He states he has tried NyQuil, Mucinex, and cough medicine with no relief. Pt is HIV+ and states he follows with ID in [facility]. He states he has been compliant with treatment but does not know his last CD4 count or viral load but he states that he knows that "everything was good."  Past Medical History  Diagnosis Date  . HIV disease (HCC)    History reviewed. No pertinent past surgical history. No family history on file. Social History  Substance Use Topics  . Smoking status: Light Tobacco Smoker -- 0.50 packs/day    Types: Cigarettes  . Smokeless tobacco: Never Used  . Alcohol Use: Yes     Comment: occasional    Review of Systems  All other systems reviewed and are negative.     Allergies  Review of patient's allergies indicates no known allergies.  Home Medications   Prior to Admission medications   Medication Sig Start Date End Date Taking? Authorizing Provider  HYDROcodone-acetaminophen (NORCO) 5-325 MG tablet Take 1-2 tablets by mouth every 6 (six) hours as needed (for pain). 12/06/14   John Molpus, MD   BP 118/76 mmHg  Pulse 100  Temp(Src) 98.7 F (37.1 C)  (Oral)  Resp 20  Ht 6\' 1"  (1.854 m)  Wt 68.04 kg  BMI 19.79 kg/m2  SpO2 100% Physical Exam  Constitutional: He is oriented to person, place, and time. No distress.  Pt appears uncomfortable, unwell appearing, though NAD.  HENT:  Right Ear: Tympanic membrane and external ear normal.  Left Ear: Tympanic membrane and external ear normal.  Nose: Nose normal.  Mouth/Throat: Oropharynx is clear and moist. No oropharyngeal exudate.  Eyes: Conjunctivae and EOM are normal. Pupils are equal, round, and reactive to light. No scleral icterus.  Neck: Normal range of motion. Neck supple.  Cardiovascular: Normal rate, regular rhythm, normal heart sounds and intact distal pulses.   Pulmonary/Chest: Effort normal and breath sounds normal. No respiratory distress. He has no wheezes. He has no rales. He exhibits no tenderness.  Abdominal: Soft. Bowel sounds are normal. He exhibits no distension. There is no tenderness.  Musculoskeletal: Normal range of motion. He exhibits no edema or tenderness.  Lymphadenopathy:    He has no cervical adenopathy.  Neurological: He is alert and oriented to person, place, and time. No cranial nerve deficit.  Skin: Skin is warm and dry. No rash noted. He is not diaphoretic. No pallor.  Psychiatric: He has a normal mood and affect.  Nursing note and vitals reviewed.   ED Course  Procedures (including critical care time) Labs Review Labs Reviewed  BASIC METABOLIC PANEL - Abnormal; Notable for the following:    Calcium 8.3 (*)    All other components within normal limits  CBC WITH DIFFERENTIAL/PLATELET - Abnormal; Notable for the following:    HCT 36.9 (*)    MCHC 36.6 (*)    All other components within normal limits    Imaging Review Dg Chest 2 View  01/21/2015  CLINICAL DATA:  Chills, fever, body aches EXAM: CHEST  2 VIEW COMPARISON:  09/30/2009 FINDINGS: There is mild left lower lobe hazy airspace disease. There is no pleural effusion or pneumothorax. The heart  and mediastinal contours are unremarkable. The osseous structures are unremarkable. IMPRESSION: Mild left lower lobe hazy airspace disease concerning for pneumonia. Electronically Signed   By: Elige KoHetal  Patel   On: 01/21/2015 15:15   I have personally reviewed and evaluated these images and lab results as part of my medical decision-making.   EKG Interpretation None      MDM   Final diagnoses:  Community acquired pneumonia    I suspect viral etiology. However given pt's history of HIV with progressive symptoms and mild tachycardia to 100, will obtain CXR to r/o pneumonia or other acute cardiopulmonary etiology. Will also give 1L NS bolus, toradol, and tylenol.  CXR does show e/o mild left lower lobe hazy area concerning for pneumonia. Pt is not hypoxic, tachypneic. He is not working hard to breathe. His labs are unremarkable. At this point I believe pt is stable for outpatient treatment. Will treat with levaquin and give bactrim for PCP prophylaxis. Will give first dose Levaquin IV and bactrim PO in the ED. Strict ER precautions and close ID f/u instructed. Pt verbalized understanding.  Carlene CoriaSerena Y Tymere Depuy, PA-C 01/21/15 1637  Rolland PorterMark James, MD 01/28/15 [phone]

## 2015-01-21 NOTE — Discharge Instructions (Signed)
You were seen in the emergency room today and found to have evidence of a mild pneumonia on your chest x-ray. We gave you the first dose of antibiotics in the ER and will give you prescriptions for six more days of treatment for a total of seven days. Please call your infectious disease doctor tomorrow to schedule a follow up appointment. Return to the emergency room for any new or worsening symptoms.   Community-Acquired Pneumonia, Adult Pneumonia is an infection of the lungs. There are different types of pneumonia. One type can develop while a person is in a hospital. A different type, called community-acquired pneumonia, develops in people who are not, or have not recently been, in the hospital or other health care facility.  CAUSES Pneumonia may be caused by bacteria, viruses, or funguses. Community-acquired pneumonia is often caused by Streptococcus pneumonia bacteria. These bacteria are often passed from one person to another by breathing in droplets from the cough or sneeze of an infected person. RISK FACTORS The condition is more likely to develop in:  People who havechronic diseases, such as chronic obstructive pulmonary disease (COPD), asthma, congestive heart failure, cystic fibrosis, diabetes, or kidney disease.  People who haveearly-stage or late-stage HIV.  People who havesickle cell disease.  People who havehad their spleen removed (splenectomy).  People who havepoor Administrator.  People who havemedical conditions that increase the risk of breathing in (aspirating) secretions their own mouth and nose.   People who havea weakened immune system (immunocompromised).  People who smoke.  People whotravel to areas where pneumonia-causing germs commonly exist.  People whoare around animal habitats or animals that have pneumonia-causing germs, including birds, bats, rabbits, cats, and farm animals. SYMPTOMS Symptoms of this condition include:  Adry cough.  A wet  (productive) cough.  Fever.  Sweating.  Chest pain, especially when breathing deeply or coughing.  Rapid breathing or difficulty breathing.  Shortness of breath.  Shaking chills.  Fatigue.  Muscle aches. DIAGNOSIS Your health care provider will take a medical history and perform a physical exam. You may also have other tests, including:  Imaging studies of your chest, including X-rays.  Tests to check your blood oxygen level and other blood gases.  Other tests on blood, mucus (sputum), fluid around your lungs (pleural fluid), and urine. If your pneumonia is severe, other tests may be done to identify the specific cause of your illness. TREATMENT The type of treatment that you receive depends on many factors, such as the cause of your pneumonia, the medicines you take, and other medical conditions that you have. For most adults, treatment and recovery from pneumonia may occur at home. In some cases, treatment must happen in a hospital. Treatment may include:  Antibiotic medicines, if the pneumonia was caused by bacteria.  Antiviral medicines, if the pneumonia was caused by a virus.  Medicines that are given by mouth or through an IV tube.  Oxygen.  Respiratory therapy. Although rare, treating severe pneumonia may include:  Mechanical ventilation. This is done if you are not breathing well on your own and you cannot maintain a safe blood oxygen level.  Thoracentesis. This procedureremoves fluid around one lung or both lungs to help you breathe better. HOME CARE INSTRUCTIONS  Take over-the-counter and prescription medicines only as told by your health care provider.  Only takecough medicine if you are losing sleep. Understand that cough medicine can prevent your body's natural ability to remove mucus from your lungs.  If you were prescribed an antibiotic  medicine, take it as told by your health care provider. Do not stop taking the antibiotic even if you start to feel  better.  Sleep in a semi-upright position at night. Try sleeping in a reclining chair, or place a few pillows under your head.  Do not use tobacco products, including cigarettes, chewing tobacco, and e-cigarettes. If you need help quitting, ask your health care provider.  Drink enough water to keep your urine clear or pale yellow. This will help to thin out mucus secretions in your lungs. PREVENTION There are ways that you can decrease your risk of developing community-acquired pneumonia. Consider getting a pneumococcal vaccine if:  You are older than 25 years of age.  You are older than 25 years of age and are undergoing cancer treatment, have chronic lung disease, or have other medical conditions that affect your immune system. Ask your health care provider if this applies to you. There are different types and schedules of pneumococcal vaccines. Ask your health care provider which vaccination option is best for you. You may also prevent community-acquired pneumonia if you take these actions:  Get an influenza vaccine every year. Ask your health care provider which type of influenza vaccine is best for you.  Go to the dentist on a regular basis.  Wash your hands often. Use hand sanitizer if soap and water are not available. SEEK MEDICAL CARE IF:  You have a fever.  You are losing sleep because you cannot control your cough with cough medicine. SEEK IMMEDIATE MEDICAL CARE IF:  You have worsening shortness of breath.  You have increased chest pain.  Your sickness becomes worse, especially if you are an older adult or have a weakened immune system.  You cough up blood.   This information is not intended to replace advice given to you by your health care provider. Make sure you discuss any questions you have with your health care provider.   Document Released: 12/26/2004 Document Revised: 09/16/2014 Document Reviewed: 04/22/2014 Elsevier Interactive Patient Education [email or web address]  Elsevier Inc.

## 2015-01-21 NOTE — ED Notes (Signed)
Chills, fever, body aches this am.

## 2015-07-05 ENCOUNTER — Emergency Department (HOSPITAL_BASED_OUTPATIENT_CLINIC_OR_DEPARTMENT_OTHER)
Admission: EM | Admit: 2015-07-05 | Discharge: 2015-07-05 | Disposition: A | Discharge status: Home / Self Care | Payer: BLUE CROSS/BLUE SHIELD | Attending: Emergency Medicine | Admitting: Emergency Medicine

## 2015-07-05 ENCOUNTER — Encounter (HOSPITAL_BASED_OUTPATIENT_CLINIC_OR_DEPARTMENT_OTHER): Payer: Self-pay | Admitting: Emergency Medicine

## 2015-07-05 DIAGNOSIS — F17210 Nicotine dependence, cigarettes, uncomplicated: Secondary | ICD-10-CM | POA: Insufficient documentation

## 2015-07-05 DIAGNOSIS — B359 Dermatophytosis, unspecified: Secondary | ICD-10-CM | POA: Diagnosis not present

## 2015-07-05 DIAGNOSIS — R21 Rash and other nonspecific skin eruption: Secondary | ICD-10-CM | POA: Diagnosis present

## 2015-07-05 MED ORDER — CLOTRIMAZOLE-BETAMETHASONE 1-0.05 % EX CREA: TOPICAL_CREAM | CUTANEOUS | Status: DC

## 2015-07-05 NOTE — ED Notes (Signed)
Patient states that he has a rash to his right axilla region and to his bilateral groins

## 2015-07-05 NOTE — Discharge Instructions (Signed)
Jock Itch  Jock itch (tinea cruris) is a fungal infection of the skin in the groin area. It is sometimes called ringworm, even though it is not caused by worms. It is caused by a fungus, which is a type of germ that thrives in dark, damp places. Jock itch causes a rash and itching in the groin and upper thigh area. It usually goes away in 2-3 weeks with treatment.  CAUSES  The fungus that causes jock itch may be spread by:  · Touching a fungus infection elsewhere on your body--such as athlete's foot--and then touching your groin area.  · Sharing towels or clothing with an infected person.  RISK FACTORS  Jock itch is most common in men and adolescent boys. This condition is more likely to develop from:  · Being in hot, humid climates.  · Wearing tight-fitting clothing or wet bathing suits for long periods of time.  · Participating in sports.  · Being overweight.  · Having diabetes.  SYMPTOMS  Symptoms of jock itch may include:  · A red, pink, or brown rash in the groin area. The rash may spread to the thighs, anus, and buttocks.  · Dry and scaly skin on or around the rash.  · Itchiness.  DIAGNOSIS  Most often, a health care provider can make the diagnosis by looking at your rash. Sometimes, a scraping of the infected skin will be taken. This sample may be tested by looking at it under a microscope or by trying to grow the fungus from the sample (culture).   TREATMENT  Treatment for this condition may include:  · Antifungal medicine to kill the fungus. This may be in various forms:    Skin cream or ointment.    Medicine taken by mouth.  · Skin cream or ointment to reduce the itching.  · Compresses or medicated powders to dry the infected skin.  HOME CARE INSTRUCTIONS  · Take medicines only as directed by your health care provider. Apply skin creams or ointments exactly as directed.  · Wear loose-fitting clothing.    Men should wear cotton boxer shorts.    Women should wear cotton underwear.  · Change your underwear  every day to keep your groin dry.  · Avoid hot baths.  · Dry your groin area well after bathing.    Use a separate towel to dry your groin area. This will help to prevent a spreading of the infection to other areas of your body.  · Do not scratch the affected area.  · Do not share towels with other people.  SEEK MEDICAL CARE IF:  · Your rash does not improve or it gets worse after 2 weeks of treatment.  · Your rash is spreading.  · Your rash returns after treatment is finished.  · You have a fever.  · You have redness, swelling, or pain in the area around your rash.  · You have fluid, blood, or pus coming from your rash.  · Your have your rash for more than 4 weeks.     This information is not intended to replace advice given to you by your health care provider. Make sure you discuss any questions you have with your health care provider.     Document Released: 12/16/2001 Document Revised: 01/16/2014 Document Reviewed: 10/07/2013  Elsevier Interactive Patient Education ©2016 Elsevier Inc.

## 2015-07-05 NOTE — ED Provider Notes (Signed)
CSN: [id number]     Arrival date & time 07/05/15  1745 History   First MD Initiated Contact with Patient 07/05/15 1956     Chief Complaint  Patient presents with  . Rash     (Consider location/radiation/quality/duration/timing/severity/associated sxs/prior Treatment) HPI Carl Bradley This 25 year old male who presents emergency department with chief complaint of itchy rash. His past medical history of HIV. [facility]. States that he knows his viral load was low and his CD4 count was elevated, but did not know the number. Patient complains of an itchy circular rash under his right armpit and the same rash in his groin. He denies any lesions on his penis or discharge, dysuria, abdominal pain.   Past Medical History  Diagnosis Date  . HIV disease (HCC)    History reviewed. No pertinent past surgical history. History reviewed. No pertinent family history. Social History  Substance Use Topics  . Smoking status: Light Tobacco Smoker -- 0.50 packs/day    Types: Cigarettes  . Smokeless tobacco: Never Used  . Alcohol Use: Yes     Comment: occasional    Review of Systems   Ten systems reviewed and are negative for acute change, except as noted in the HPI.   Allergies  Review of patient's allergies indicates no known allergies.  Home Medications   Prior to Admission medications   Medication Sig Start Date End Date Taking? Authorizing Provider  HYDROcodone-acetaminophen (NORCO) 5-325 MG tablet Take 1-2 tablets by mouth every 6 (six) hours as needed (for pain). 12/06/14   John Molpus, MD  levofloxacin (LEVAQUIN) 500 MG tablet Take 1 tablet (500 mg total) by mouth daily. 01/21/15   Ace GinsSerena Y Sam, PA-C   BP 123/84 mmHg  Pulse 81  Temp(Src) 99.1 F (37.3 C) (Oral)  Resp 18  Ht 6\' 1"  (1.854 m)  Wt 68.04 kg  BMI 19.79 kg/m2  SpO2 100% Physical Exam Physical Exam  Nursing note and vitals reviewed. Constitutional: He appears  well-developed and well-nourished. No distress.  HENT:  Head: Normocephalic and atraumatic.  Eyes: Conjunctivae normal are normal. No scleral icterus.  Neck: Normal range of motion. Neck supple.  Cardiovascular: Normal rate, regular rhythm and normal heart sounds.   Pulmonary/Chest: Effort normal and breath sounds normal. No respiratory distress.  Abdominal: Soft. There is no tenderness.  Musculoskeletal: He exhibits no edema.  Neurological: He is alert.  Skin: Skin is warm and dry. He is not diaphoretic.  Circular rash under the right axilla with fine scale, central clearing.  Similar rash present in the groin bilaterally.  Psychiatric: His behavior is normal.    ED Course  Procedures (including critical care time) Labs Review Labs Reviewed - No data to display  Imaging Review No results found. I have personally reviewed and evaluated these images and lab results as part of my medical decision-making.   EKG Interpretation None      MDM   Final diagnoses:  Tinea        Patient with tinea infection. Will treat with anti-fungal medication. Pt instructed to keep the area dry. Contact precautions given. No signs of secondary infection. Follow up with pcp in 2-3 days. Return precautions discussed. Pt is safe for discharge at this time }        Arthor Captainbigail Inika Bellanger, PA-C 07/05/15 2025  Alvira MondayErin Schlossman, MD 07/06/15 [id number]

## 2015-07-29 ENCOUNTER — Encounter (HOSPITAL_BASED_OUTPATIENT_CLINIC_OR_DEPARTMENT_OTHER): Payer: Self-pay | Admitting: Emergency Medicine

## 2015-07-29 ENCOUNTER — Observation Stay (HOSPITAL_COMMUNITY): Payer: BLUE CROSS/BLUE SHIELD | Admitting: Certified Registered Nurse Anesthetist

## 2015-07-29 ENCOUNTER — Inpatient Hospital Stay (HOSPITAL_BASED_OUTPATIENT_CLINIC_OR_DEPARTMENT_OTHER)
Admission: EM | Admit: 2015-07-29 | Discharge: 2015-07-31 | DRG: 344 | Disposition: A | Discharge status: Home / Self Care | Payer: BLUE CROSS/BLUE SHIELD | Attending: Emergency Medicine | Admitting: Emergency Medicine

## 2015-07-29 ENCOUNTER — Emergency Department (HOSPITAL_BASED_OUTPATIENT_CLINIC_OR_DEPARTMENT_OTHER): Payer: BLUE CROSS/BLUE SHIELD

## 2015-07-29 ENCOUNTER — Encounter (HOSPITAL_COMMUNITY): Admission: EM | Disposition: A | Discharge status: Home / Self Care | Payer: Self-pay | Source: Home / Self Care

## 2015-07-29 ENCOUNTER — Other Ambulatory Visit: Payer: Self-pay | Admitting: General Surgery

## 2015-07-29 DIAGNOSIS — K611 Rectal abscess: Secondary | ICD-10-CM | POA: Diagnosis present

## 2015-07-29 DIAGNOSIS — K6289 Other specified diseases of anus and rectum: Secondary | ICD-10-CM

## 2015-07-29 DIAGNOSIS — F17210 Nicotine dependence, cigarettes, uncomplicated: Secondary | ICD-10-CM | POA: Diagnosis present

## 2015-07-29 DIAGNOSIS — B20 Human immunodeficiency virus [HIV] disease: Secondary | ICD-10-CM | POA: Diagnosis present

## 2015-07-29 DIAGNOSIS — Z21 Asymptomatic human immunodeficiency virus [HIV] infection status: Secondary | ICD-10-CM | POA: Diagnosis present

## 2015-07-29 DIAGNOSIS — K5900 Constipation, unspecified: Secondary | ICD-10-CM | POA: Diagnosis present

## 2015-07-29 DIAGNOSIS — D72829 Elevated white blood cell count, unspecified: Secondary | ICD-10-CM | POA: Diagnosis present

## 2015-07-29 HISTORY — PX: INCISION AND DRAINAGE PERIRECTAL ABSCESS: SHX1804

## 2015-07-29 LAB — CBC
HCT: 35.8 % — ABNORMAL LOW (ref 39.0–52.0)
Hemoglobin: 13.5 g/dL (ref 13.0–17.0)
MCH: 31.3 pg (ref 26.0–34.0)
MCHC: 37.7 g/dL — AB (ref 30.0–36.0)
MCV: 83.1 fL (ref 78.0–100.0)
PLATELETS: 223 10*3/uL (ref 150–400)
RBC: 4.31 MIL/uL (ref 4.22–5.81)
RDW: 12.6 % (ref 11.5–15.5)
WBC: 8.4 10*3/uL (ref 4.0–10.5)

## 2015-07-29 LAB — BASIC METABOLIC PANEL
ANION GAP: 7 (ref 5–15)
BUN: 8 mg/dL (ref 6–20)
CALCIUM: 8.9 mg/dL (ref 8.9–10.3)
CO2: 27 mmol/L (ref 22–32)
CREATININE: 0.78 mg/dL (ref 0.61–1.24)
Chloride: 103 mmol/L (ref 101–111)
Glucose, Bld: 85 mg/dL (ref 65–99)
Potassium: 3.9 mmol/L (ref 3.5–5.1)
Sodium: 137 mmol/L (ref 135–145)

## 2015-07-29 LAB — OCCULT BLOOD X 1 CARD TO LAB, STOOL: FECAL OCCULT BLD: NEGATIVE

## 2015-07-29 LAB — SURGICAL PCR SCREEN
MRSA, PCR: NEGATIVE
STAPHYLOCOCCUS AUREUS: NEGATIVE

## 2015-07-29 SURGERY — INCISION AND DRAINAGE, ABSCESS, PERIRECTAL
Anesthesia: General

## 2015-07-29 MED ORDER — LIDOCAINE HCL (CARDIAC) 20 MG/ML IV SOLN
INTRAVENOUS | Status: AC
  Filled 2015-07-29: qty 5

## 2015-07-29 MED ORDER — FENTANYL CITRATE (PF) 100 MCG/2ML IJ SOLN
INTRAMUSCULAR | Status: AC
  Filled 2015-07-29: qty 2

## 2015-07-29 MED ORDER — HYDROCODONE-ACETAMINOPHEN 5-325 MG PO TABS
1.0000 | ORAL_TABLET | Freq: Once | ORAL | Status: AC
  Administered 2015-07-29: 1 via ORAL
  Filled 2015-07-29: qty 1

## 2015-07-29 MED ORDER — MORPHINE SULFATE (PF) 2 MG/ML IV SOLN
2.0000 mg | INTRAVENOUS | Status: DC | PRN
  Administered 2015-07-30 – 2015-07-31 (×2): 2 mg via INTRAVENOUS
  Filled 2015-07-29 (×2): qty 1

## 2015-07-29 MED ORDER — SODIUM CHLORIDE 0.9 % IV SOLN: INTRAVENOUS | Status: DC

## 2015-07-29 MED ORDER — PIPERACILLIN-TAZOBACTAM 3.375 G IVPB
3.3750 g | Freq: Three times a day (TID) | INTRAVENOUS | Status: DC
  Administered 2015-07-29 – 2015-07-31 (×6): 3.375 g via INTRAVENOUS
  Filled 2015-07-29 (×5): qty 50

## 2015-07-29 MED ORDER — ONDANSETRON HCL 4 MG/2ML IJ SOLN
INTRAMUSCULAR | Status: AC
  Filled 2015-07-29: qty 2

## 2015-07-29 MED ORDER — PIPERACILLIN-TAZOBACTAM 3.375 G IVPB
3.3750 g | Freq: Once | INTRAVENOUS | Status: AC
  Administered 2015-07-29: 3.375 g via INTRAVENOUS
  Filled 2015-07-29: qty 50

## 2015-07-29 MED ORDER — PIPERACILLIN-TAZOBACTAM 3.375 G IVPB: 3.3750 g | Freq: Three times a day (TID) | INTRAVENOUS | Status: DC

## 2015-07-29 MED ORDER — MIDAZOLAM HCL 2 MG/2ML IJ SOLN
INTRAMUSCULAR | Status: AC
  Filled 2015-07-29: qty 2

## 2015-07-29 MED ORDER — FENTANYL CITRATE (PF) 250 MCG/5ML IJ SOLN
INTRAMUSCULAR | Status: AC
Start: 2015-07-29 — End: 2015-07-29
  Filled 2015-07-29: qty 5

## 2015-07-29 MED ORDER — MIDAZOLAM HCL 5 MG/5ML IJ SOLN
INTRAMUSCULAR | Status: DC | PRN
  Administered 2015-07-29 (×2): 1 mg via INTRAVENOUS

## 2015-07-29 MED ORDER — ONDANSETRON HCL 4 MG/2ML IJ SOLN
INTRAMUSCULAR | Status: DC | PRN
  Administered 2015-07-29: 4 mg via INTRAVENOUS

## 2015-07-29 MED ORDER — DEXTROSE-NACL 5-0.45 % IV SOLN
INTRAVENOUS | Status: AC
  Administered 2015-07-29: 11:00:00 via INTRAVENOUS

## 2015-07-29 MED ORDER — OXYCODONE-ACETAMINOPHEN 5-325 MG PO TABS
1.0000 | ORAL_TABLET | ORAL | Status: DC | PRN
  Administered 2015-07-30 – 2015-07-31 (×5): 2 via ORAL
  Filled 2015-07-29 (×5): qty 2

## 2015-07-29 MED ORDER — SODIUM CHLORIDE 0.9 % IJ SOLN
INTRAMUSCULAR | Status: AC
  Filled 2015-07-29: qty 50

## 2015-07-29 MED ORDER — BUPIVACAINE LIPOSOME 1.3 % IJ SUSP
20.0000 mL | Freq: Once | INTRAMUSCULAR | Status: AC
  Administered 2015-07-29: 40 mL
  Filled 2015-07-29: qty 20

## 2015-07-29 MED ORDER — FENTANYL CITRATE (PF) 100 MCG/2ML IJ SOLN
INTRAMUSCULAR | Status: DC | PRN
  Administered 2015-07-29: 50 ug via INTRAVENOUS
  Administered 2015-07-29: 25 ug via INTRAVENOUS
  Administered 2015-07-29 (×3): 50 ug via INTRAVENOUS
  Administered 2015-07-29: 25 ug via INTRAVENOUS
  Administered 2015-07-29 (×2): 50 ug via INTRAVENOUS

## 2015-07-29 MED ORDER — SODIUM CHLORIDE 0.9 % IJ SOLN: INTRAMUSCULAR | Status: DC | PRN

## 2015-07-29 MED ORDER — SODIUM CHLORIDE 0.9 % IV BOLUS (SEPSIS)
500.0000 mL | Freq: Once | INTRAVENOUS | Status: AC
  Administered 2015-07-29: 500 mL via INTRAVENOUS

## 2015-07-29 MED ORDER — LIDOCAINE HCL (CARDIAC) 20 MG/ML IV SOLN
INTRAVENOUS | Status: DC | PRN
  Administered 2015-07-29: 100 mg via INTRAVENOUS

## 2015-07-29 MED ORDER — HYDROMORPHONE HCL 1 MG/ML IJ SOLN: 0.2500 mg | INTRAMUSCULAR | Status: DC | PRN

## 2015-07-29 MED ORDER — BUPIVACAINE-EPINEPHRINE 0.25% -1:200000 IJ SOLN
INTRAMUSCULAR | Status: AC
  Filled 2015-07-29: qty 1

## 2015-07-29 MED ORDER — SODIUM CHLORIDE 0.9 % IV SOLN
INTRAVENOUS | Status: DC
  Administered 2015-07-29 – 2015-07-30 (×2): via INTRAVENOUS

## 2015-07-29 MED ORDER — LACTATED RINGERS IV SOLN
INTRAVENOUS | Status: DC
  Administered 2015-07-29: 14:00:00 via INTRAVENOUS

## 2015-07-29 MED ORDER — PROPOFOL 10 MG/ML IV BOLUS
INTRAVENOUS | Status: AC
  Filled 2015-07-29: qty 20

## 2015-07-29 MED ORDER — PROPOFOL 10 MG/ML IV BOLUS
INTRAVENOUS | Status: DC | PRN
  Administered 2015-07-29: 130 mg via INTRAVENOUS

## 2015-07-29 MED ORDER — SODIUM CHLORIDE 0.9 % IJ SOLN
INTRAMUSCULAR | Status: DC | PRN
  Administered 2015-07-29: 20 mL

## 2015-07-29 MED ORDER — DEXAMETHASONE SODIUM PHOSPHATE 10 MG/ML IJ SOLN
INTRAMUSCULAR | Status: AC
  Filled 2015-07-29: qty 1

## 2015-07-29 MED ORDER — IOPAMIDOL (ISOVUE-300) INJECTION 61%
100.0000 mL | Freq: Once | INTRAVENOUS | Status: AC | PRN
  Administered 2015-07-29: 100 mL via INTRAVENOUS

## 2015-07-29 MED ORDER — PROMETHAZINE HCL 25 MG/ML IJ SOLN
6.2500 mg | INTRAMUSCULAR | Status: DC | PRN
Start: 2015-07-29 — End: 2015-07-29

## 2015-07-29 SURGICAL SUPPLY — 27 items
BLADE HEX COATED 2.75 (ELECTRODE) ×3 IMPLANT
BLADE SURG 15 STRL LF DISP TIS (BLADE) ×1 IMPLANT
BLADE SURG 15 STRL SS (BLADE) ×2
COVER MAYO STAND STRL (DRAPES) ×3 IMPLANT
COVER SURGICAL LIGHT HANDLE (MISCELLANEOUS) IMPLANT
DRSG PAD ABDOMINAL 8X10 ST (GAUZE/BANDAGES/DRESSINGS) ×6 IMPLANT
ELECT PENCIL ROCKER SW 15FT (MISCELLANEOUS) ×3 IMPLANT
ELECT REM PT RETURN 9FT ADLT (ELECTROSURGICAL) ×3
ELECTRODE REM PT RTRN 9FT ADLT (ELECTROSURGICAL) ×1 IMPLANT
GAUZE PACKING IODOFORM 1/4X15 (GAUZE/BANDAGES/DRESSINGS) ×3 IMPLANT
GAUZE SPONGE 4X4 12PLY STRL (GAUZE/BANDAGES/DRESSINGS) ×3 IMPLANT
GAUZE SPONGE 4X4 16PLY XRAY LF (GAUZE/BANDAGES/DRESSINGS) ×3 IMPLANT
GLOVE BIOGEL PI IND STRL 7.0 (GLOVE) ×3 IMPLANT
GLOVE BIOGEL PI INDICATOR 7.0 (GLOVE) ×6
GOWN STRL REUS W/TWL LRG LVL3 (GOWN DISPOSABLE) IMPLANT
GOWN STRL REUS W/TWL XL LVL3 (GOWN DISPOSABLE) ×6 IMPLANT
KIT BASIN OR (CUSTOM PROCEDURE TRAY) ×3 IMPLANT
LOOP VESSEL MAXI BLUE (MISCELLANEOUS) ×3 IMPLANT
LUBRICANT JELLY K Y 4OZ (MISCELLANEOUS) ×3 IMPLANT
NEEDLE HYPO 25X1 1.5 SAFETY (NEEDLE) ×3 IMPLANT
PACK LITHOTOMY IV (CUSTOM PROCEDURE TRAY) ×3 IMPLANT
SOL PREP PROV IODINE SCRUB 4OZ (MISCELLANEOUS) ×3 IMPLANT
SWAB COLLECTION DEVICE MRSA (MISCELLANEOUS) ×3 IMPLANT
SYR CONTROL 10ML LL (SYRINGE) ×6 IMPLANT
TOWEL OR 17X26 10 PK STRL BLUE (TOWEL DISPOSABLE) ×3 IMPLANT
UNDERPAD 30X30 INCONTINENT (UNDERPADS AND DIAPERS) IMPLANT
YANKAUER SUCT BULB TIP 10FT TU (MISCELLANEOUS) ×3 IMPLANT

## 2015-07-29 NOTE — H&P (Signed)
Carl Bradley is an 25 y.o. male.   Chief Complaint:   Rectal pain, nausea and vomiting  HPI: Pt reports onset of symptoms about 2 days ago.  It has become worse.  He was seen in [facility] and referred here.  He has had a gluteal abscess, before but not a perirectal abscess like he has now.  His only other illness is his HIV and he is followed by [facility] in [place].  I cannot find the info in [facility] for this.  He does not know what  his medications are.  Will ask pharmacy to see and try to obtain today.   Work up shows: he is afebrile, VSS.  Labs OK, WBC is normal.  CTscan shows: perirectal fluid collection on the left measuring up to 1.8 cm AP by 1.2 cm transverse by 5.8 cm craniocaudal consistent with a perirectal abscess. There is mass effect on the rectum from the abscess and associated soft tissue swelling. No other abscess is identified. There is no lymphadenopathy. No focal bony abnormality is identified  Past Medical History  Diagnosis Date  . HIV disease (HCC)     History reviewed. No pertinent past surgical history.  History reviewed. No pertinent family history. Social History:  reports that he has been smoking Cigarettes.  He has been smoking about 0.50 packs per day. He has never used smokeless tobacco. He reports that he drinks alcohol. He reports that he does not use illicit drugs.  Allergies: No Known Allergies Tobacco;  None ETOH:  Weekend use Drugs:  None Works at [employer].     Prior to Admission medications   Medication Sig Start Date End Date Taking? Authorizing Provider  clotrimazole-betamethasone (LOTRISONE) cream Apply to affected area 2 times daily prn 07/05/15   Abigail Harris, PA-C  HYDROcodone-acetaminophen (NORCO) 5-325 MG tablet Take 1-2 tablets by mouth every 6 (six) hours as needed (for pain). 12/06/14   John Molpus, MD  levofloxacin (LEVAQUIN) 500 MG tablet Take 1 tablet (500 mg total) by mouth daily. 01/21/15    Serena Y Sam, PA-C     Results for orders placed or performed during the hospital encounter of 07/29/15 (from the past 48 hour(s))  Occult blood card to lab, stool Provider will collect     Status: None   Collection Time: 07/29/15  8:34 AM  Result Value Ref Range   Fecal Occult Bld NEGATIVE NEGATIVE  CBC     Status: Abnormal   Collection Time: 07/29/15  8:45 AM  Result Value Ref Range   WBC 8.4 4.0 - 10.5 K/uL   RBC 4.31 4.22 - 5.81 MIL/uL   Hemoglobin 13.5 13.0 - 17.0 g/dL   HCT 35.8 (L) 39.0 - 52.0 %   MCV 83.1 78.0 - 100.0 fL   MCH 31.3 26.0 - 34.0 pg   MCHC 37.7 (H) 30.0 - 36.0 g/dL    Comment: SPHEROCYTES,TARGET CELLS, STOMATOCYTES NOTED   RDW 12.6 11.5 - 15.5 %   Platelets 223 150 - 400 K/uL  Basic metabolic panel     Status: None   Collection Time: 07/29/15  8:45 AM  Result Value Ref Range   Sodium 137 135 - 145 mmol/L   Potassium 3.9 3.5 - 5.1 mmol/L   Chloride 103 101 - 111 mmol/L   CO2 27 22 - 32 mmol/L   Glucose, Bld 85 65 - 99 mg/dL   BUN 8 6 - 20 mg/dL   Creatinine, Ser 0.78 0.61 -   1.24 mg/dL   Calcium 8.9 8.9 - 10.3 mg/dL   GFR calc non Af Amer >60 >60 mL/min   GFR calc Af Amer >60 >60 mL/min    Comment: (NOTE) The eGFR has been calculated using the CKD EPI equation. This calculation has not been validated in all clinical situations. eGFR's persistently <60 mL/min signify possible Chronic Kidney Disease.    Anion gap 7 5 - 15   Ct Pelvis W Contrast  07/29/2015  CLINICAL DATA:  Constipation for 3 days. Pelvic pain. Question abscess. EXAM: CT PELVIS WITH CONTRAST TECHNIQUE: Multidetector CT imaging of the pelvis was performed using the standard protocol following the bolus administration of intravenous contrast. CONTRAST:  100 ml ISOVUE-300 IOPAMIDOL (ISOVUE-300) INJECTION 61% COMPARISON:  None. FINDINGS: The patient has a rim enhancing perirectal fluid collection on the left measuring up to 1.8 cm AP by 1.2 cm transverse by 5.8 cm craniocaudal consistent with  a perirectal abscess. There is mass effect on the rectum from the abscess and associated soft tissue swelling. No other abscess is identified. There is no lymphadenopathy. No focal bony abnormality is identified. IMPRESSION: Prominent left perirectal abscess as described above. Electronically Signed   By: Thomas  Dalessio M.D.   On: 07/29/2015 09:48    Review of Systems  All other systems reviewed and are negative.   Blood pressure 128/82, pulse 78, temperature 98.7 F (37.1 C), temperature source Oral, resp. rate 18, height 6' (1.829 m), weight 68.04 kg (150 lb), SpO2 100 %. Physical Exam  Constitutional: He is oriented to person, place, and time. He appears well-developed and well-nourished. No distress.  HENT:  Head: Normocephalic and atraumatic.  Nose: Nose normal.  Eyes: Right eye exhibits no discharge. Left eye exhibits no discharge. No scleral icterus.  Neck: Normal range of motion. Neck supple. No JVD present. No tracheal deviation present. No thyromegaly present.  Cardiovascular: Normal rate, regular rhythm, normal heart sounds and intact distal pulses.   No murmur heard. Respiratory: Effort normal and breath sounds normal. No respiratory distress. He has no wheezes. He has no rales. He exhibits no tenderness.  GI: Soft. Bowel sounds are normal. He exhibits no distension and no mass. There is no tenderness. There is no rebound and no guarding.  Genitourinary:  Tender with some small condylomata   Musculoskeletal: He exhibits no edema.  Lymphadenopathy:    He has no cervical adenopathy.  Neurological: He is alert and oriented to person, place, and time. No cranial nerve deficit.  Skin: Skin is warm and dry. No rash noted. He is not diaphoretic. No erythema. No pallor.  Psychiatric: He has a normal mood and affect. His behavior is normal. Thought content normal.     Assessment/Plan Perirectal abscess HIV  Plan:  I&D left perirectal abscess.    ,,  PA-C 07/29/2015, 1:42 PM    

## 2015-07-29 NOTE — Anesthesia Preprocedure Evaluation (Addendum)
Anesthesia Evaluation  Patient identified by MRN, date of birth, ID band Patient awake    Reviewed: Allergy & Precautions, NPO status , Patient's Chart, lab work & pertinent test results  Airway Mallampati: II  TM Distance: >3 FB Neck ROM: Full    Dental  (+) Teeth Intact, Dental Advisory Given, Chipped,    Pulmonary neg pulmonary ROS, Current Smoker,    Pulmonary exam normal breath sounds clear to auscultation       Cardiovascular Exercise Tolerance: Good negative cardio ROS Normal cardiovascular exam Rhythm:Regular Rate:Normal     Neuro/Psych PSYCHIATRIC DISORDERS negative neurological ROS     GI/Hepatic negative GI ROS, Neg liver ROS,   Endo/Other  negative endocrine ROS  Renal/GU negative Renal ROS  negative genitourinary   Musculoskeletal negative musculoskeletal ROS (+)   Abdominal   Peds negative pediatric ROS (+)  Hematology  (+) HIV,   Anesthesia Other Findings Day of surgery medications reviewed with the patient.  Reproductive/Obstetrics negative OB ROS                           Anesthesia Physical Anesthesia Plan  ASA: III  Anesthesia Plan: General   Post-op Pain Management:    Induction: Intravenous  Airway Management Planned: LMA  Additional Equipment:   Intra-op Plan:   Post-operative Plan: Extubation in OR  Informed Consent: I have reviewed the patients History and Physical, chart, labs and discussed the procedure including the risks, benefits and alternatives for the proposed anesthesia with the patient or authorized representative who has indicated his/her understanding and acceptance.   Dental advisory given  Plan Discussed with: CRNA  Anesthesia Plan Comments: (Risks/benefits of general anesthesia discussed with patient including risk of damage to teeth, lips, gum, and tongue, nausea/vomiting, allergic reactions to medications, and the possibility of  heart attack, stroke and death.  All patient questions answered.  Patient wishes to proceed.)       Anesthesia Quick Evaluation

## 2015-07-29 NOTE — ED Notes (Signed)
MD at bedside. 

## 2015-07-29 NOTE — Anesthesia Procedure Notes (Signed)
Procedure Name: LMA Insertion Date/Time: 07/29/2015 2:09 PM Performed by: Kym GroomWALKER, Cylus Douville L Pre-anesthesia Checklist: Patient identified, Emergency Drugs available, Suction available, Patient being monitored and Timeout performed Patient Re-evaluated:Patient Re-evaluated prior to inductionOxygen Delivery Method: Circle system utilized Preoxygenation: Pre-oxygenation with 100% oxygen Intubation Type: IV induction Ventilation: Mask ventilation without difficulty LMA: LMA inserted LMA Size: 4.0 Number of attempts: 1 Tube secured with: Tape Dental Injury: Teeth and Oropharynx as per pre-operative assessment

## 2015-07-29 NOTE — Op Note (Signed)
Preoperative Diagnosis: Rectal pain [K62.89]  Postoprative Diagnosis: Rectal pain [K62.89]  Procedure: Procedure(s): IRRIGATION AND DRAINAGE PERIRECTAL ABSCESS   Surgeon: Glenna FellowsHoxworth, Koen Antilla T   Assistants: none  Anesthesia:  General endotracheal anesthesia  Indications: patient is a 25 year old male, HIV positive who presents with 2 days of increasing perirectal pain. CT scan has shown an approximately 2 x 2 x 5 cm fairly deep left-sided Perry rectal abscess. After discussing the procedure and risks with the patient's we have elected to proceed with incision and drainage.  Procedure Detail:  Arville Limeatient was brought to the operating room, placed in the supine position on the operating table, and general endotracheal anesthesia induced. He was carefully placed in lithotomy position and the peritoneum widely sterilely prepped and draped. He had received preoperative broad-spectrum IV antibiotics. Patient timeout was performed and correct procedure verified. Digital rectal examrevealed a fluctuant mass lateral to the low rectum. This was slightly palpable from the perineum. I aspirated the area for the perineum and obtained pus. This was sent for cultures.An incision was made in the left perirectal space and dissection carried down through soft tissue into the abscess cavity and a large amount of pus was drained. Slight loculations were broken up and it was thoroughly irrigated. I was able to identify an internal opening which was in the lateral position and appeared to be above the levators. Hemostasis was obtained with cautery. The soft tissue was infiltrated with Exparel. The cavity was extensively packed with 1/4 inch iodoform gauze. Hemostasis was assured. Clean gauze dressings were applied. Sponge needle and instrument counts were correct.    Findings: As above  Estimated Blood Loss:  less than 50 mL         Drains: abscess cavity packed with 1/4 inch iodoform gauze  Blood Given: none    Specimens: culture and sensitivity        Complications:  * No complications entered in OR log *         Disposition: PACU - hemodynamically stable.         Condition: stable

## 2015-07-29 NOTE — ED Provider Notes (Signed)
CSN: [id number]     Arrival date & time 07/29/15  0802 History   First MD Initiated Contact with Patient 07/29/15 [phone]     Chief Complaint  Patient presents with  . Constipation    (Consider location/radiation/quality/duration/timing/severity/associated sxs/prior Treatment) HPI   Carl Bradley is a 25-y/o male who presents for constipation x 3 days. He reports he was straining 2 days ago and has also had rectal pain since then. He vomited x 1 last night. Appetite has not been decreased. He denies abdominal pain. He has been passing flatus. He took 2 laxative pills last night but had not had a BM prior to presentation. However, this morning in the ED, he had a small BM, which did not relieve his rectal pain. He normally goes 3 times a day and says his last full BM was last week. He drinks water only a couple times a day and says he drinks a lot of soda. He gets vegetables at least once a day. He denies fevers or chills. He denies blood in stool. He has not been sexually active recently. He last saw ID for his HIV in May and says there were no problems at that time.   Past Medical History  Diagnosis Date  . HIV disease (HCC)    History reviewed. No pertinent past surgical history. No family history on file. Social History  Substance Use Topics  . Smoking status: Light Tobacco Smoker -- 0.50 packs/day    Types: Cigarettes  . Smokeless tobacco: Never Used  . Alcohol Use: Yes     Comment: occasional    Review of Systems  Constitutional: Negative for fever, chills and appetite change.  Respiratory: Negative for cough.   Cardiovascular: Negative for chest pain.  Gastrointestinal: Positive for vomiting, constipation and rectal pain. Negative for nausea and diarrhea.  Genitourinary: Negative for dysuria, frequency, penile swelling and scrotal swelling.  Musculoskeletal: Negative for myalgias, back pain and gait problem.  Skin: Negative for rash.  Allergic/Immunologic: Positive for  immunocompromised state.  Neurological: Negative for dizziness and headaches.    Allergies  Review of patient's allergies indicates no known allergies.  Home Medications   Prior to Admission medications   Medication Sig Start Date End Date Taking? Authorizing Provider  clotrimazole-betamethasone (LOTRISONE) cream Apply to affected area 2 times daily prn 07/05/15   Arthor CaptainAbigail Harris, PA-C  HYDROcodone-acetaminophen (NORCO) 5-325 MG tablet Take 1-2 tablets by mouth every 6 (six) hours as needed (for pain). 12/06/14   John Molpus, MD  levofloxacin (LEVAQUIN) 500 MG tablet Take 1 tablet (500 mg total) by mouth daily. 01/21/15   Ace GinsSerena Y Sam, PA-C   BP 134/83 mmHg  Pulse 91  Temp(Src) 98.1 F (36.7 C) (Oral)  Resp 18  Ht 6' (1.829 m)  Wt 68.04 kg  BMI 20.34 kg/m2  SpO2 100% Physical Exam  Constitutional: He is oriented to person, place, and time.  Thin, pleasant, well-appearing young man  HENT:  Head: Normocephalic and atraumatic.  Nose: Nose normal.  Mouth/Throat: Oropharynx is clear and moist.  Eyes: Conjunctivae and EOM are normal. Pupils are equal, round, and reactive to light.  Neck: Normal range of motion. Neck supple.  Cardiovascular: Normal rate, regular rhythm, normal heart sounds and intact distal pulses.   No murmur heard. Pulmonary/Chest: Effort normal and breath sounds normal. No respiratory distress. He has no wheezes. He has no rales.  Abdominal: Soft. Bowel sounds are normal. He exhibits no distension. There is no tenderness. There is no rebound and  no guarding.  Genitourinary: Penis normal.  Tiny external hemorrhoid at 6 o'clock. Large, lateral bulging area on DRE that was extremely TTP.   Musculoskeletal: Normal range of motion. He exhibits no edema or tenderness.  Neurological: He is alert and oriented to person, place, and time.  Skin: Skin is warm and dry.  Psychiatric: He has a normal mood and affect. His behavior is normal.  Nursing note and vitals  reviewed.   ED Course  Procedures (including critical care time) Labs Review Labs Reviewed  CBC - Abnormal; Notable for the following:    HCT 35.8 (*)    MCHC 37.7 (*)    All other components within normal limits  OCCULT BLOOD X 1 CARD TO LAB, STOOL  BASIC METABOLIC PANEL    Imaging Review Ct Pelvis W Contrast  07/29/2015  CLINICAL DATA:  Constipation for 3 days. Pelvic pain. Question abscess. EXAM: CT PELVIS WITH CONTRAST TECHNIQUE: Multidetector CT imaging of the pelvis was performed using the standard protocol following the bolus administration of intravenous contrast. CONTRAST:  100 ml ISOVUE-300 IOPAMIDOL (ISOVUE-300) INJECTION 61% COMPARISON:  None. FINDINGS: The patient has a rim enhancing perirectal fluid collection on the left measuring up to 1.8 cm AP by 1.2 cm transverse by 5.8 cm craniocaudal consistent with a perirectal abscess. There is mass effect on the rectum from the abscess and associated soft tissue swelling. No other abscess is identified. There is no lymphadenopathy. No focal bony abnormality is identified. IMPRESSION: Prominent left perirectal abscess as described above. Electronically Signed   By: Drusilla Kanner M.D.   On: 07/29/2015 09:48   I have personally reviewed and evaluated these images and lab results as part of my medical decision-making.   EKG Interpretation None      MDM   Final diagnoses:  Rectal pain  Perirectal abscess  Pt presents with constipation for the past few days. Given degree of pain on DRE and patient's immunocompromised status, had concern for infection and perirectal abscess. Only mild leukocytosis on CBC. Patient remained afebrile. HR normal but close to elevated in the 90s, so IVFs given. Patient found to have large perirectal abscess on CT pelvis. General surgery was consulted and direct admitted patient to [facility]. IV zosyn started, per admitting surgeon Dr. Johna Sheriff.  Dani Gobble, MD [facility],  PGY-2    Casey Burkitt, MD 07/29/15 2014  Marily Memos, MD 07/30/15 [phone]

## 2015-07-29 NOTE — Anesthesia Postprocedure Evaluation (Signed)
Anesthesia Post Note  Patient: Carl Bradley  Procedure(s) Performed: Procedure(s) (LRB): IRRIGATION AND DEBRIDEMENT PERIRECTAL ABSCESS (N/A)  Patient location during evaluation: PACU Anesthesia Type: General Level of consciousness: awake and alert Pain management: pain level controlled Vital Signs Assessment: post-procedure vital signs reviewed and stable Respiratory status: spontaneous breathing, nonlabored ventilation, respiratory function stable and patient connected to nasal cannula oxygen Cardiovascular status: blood pressure returned to baseline and stable Postop Assessment: no signs of nausea or vomiting Anesthetic complications: no    Last Vitals:  Filed Vitals:   07/29/15 1611 07/29/15 1644  BP: 115/76 117/77  Pulse: 60 62  Temp: 36.9 C 36.7 C  Resp: 12 16    Last Pain:  Filed Vitals:   07/29/15 1645  PainSc: 0-No pain                 Cecile HearingStephen Edward Turk

## 2015-07-29 NOTE — ED Provider Notes (Signed)
I saw and evaluated the patient, reviewed the resident's note and I agree with the findings and plan.  25 year old male history of HIV reportedly on his medications and with normal count however cannot verify this. He is here with 3 days of constipation and perirectal pain. No fevers, chills or systemic symptoms. Did have an episode of vomiting last night that seems to be isolated. On exam patient has a benign abdomen, did not repeat a rectal exam however reportedly had some lateral rectal tenderness with no obvious fissures. Did have internal and external hemorrhoids. Was Hemoccult positive. No obvious abnormalities otherwise. No prostate tenderness. With his history of HIV and lateral tenderness concern for fissure versus colitis versus proctitis versus abscess. We'll evaluate with CT scan and labs and help with pain and constipation. If CT okay likely just need MiraLAX and enemas.   Marily MemosJason Lynda Wanninger, MD 07/29/15 [phone]

## 2015-07-29 NOTE — Transfer of Care (Signed)
Immediate Anesthesia Transfer of Care Note  Patient: Carl KinLeon Bradley  Procedure(s) Performed: Procedure(s): IRRIGATION AND DEBRIDEMENT PERIRECTAL ABSCESS (N/A)  Patient Location: PACU  Anesthesia Type:General  Level of Consciousness:  sedated, patient cooperative and responds to stimulation  Airway & Oxygen Therapy:Patient Spontanous Breathing and Patient connected to face mask oxgen  Post-op Assessment:  Report given to PACU RN and Post -op Vital signs reviewed and stable  Post vital signs:  Reviewed and stable  Last Vitals:  Filed Vitals:   07/29/15 1154 07/29/15 1323  BP: 117/73 128/82  Pulse: 70 78  Temp:  37.1 C  Resp: 16 18    Complications: No apparent anesthesia complications

## 2015-07-29 NOTE — ED Notes (Signed)
Pt reports constipation. Last BM 3 days ago. Pt states he took a laxative yesterday without relief. Also reports rectal pain and vomiting last night.

## 2015-07-30 ENCOUNTER — Encounter (HOSPITAL_COMMUNITY): Payer: Self-pay | Admitting: General Surgery

## 2015-07-30 DIAGNOSIS — D72829 Elevated white blood cell count, unspecified: Secondary | ICD-10-CM | POA: Diagnosis present

## 2015-07-30 DIAGNOSIS — B20 Human immunodeficiency virus [HIV] disease: Secondary | ICD-10-CM | POA: Diagnosis present

## 2015-07-30 DIAGNOSIS — F17210 Nicotine dependence, cigarettes, uncomplicated: Secondary | ICD-10-CM | POA: Diagnosis present

## 2015-07-30 DIAGNOSIS — K611 Rectal abscess: Principal | ICD-10-CM | POA: Diagnosis present

## 2015-07-30 DIAGNOSIS — K5900 Constipation, unspecified: Secondary | ICD-10-CM | POA: Diagnosis present

## 2015-07-30 MED ORDER — POLYETHYLENE GLYCOL 3350 17 G PO PACK
17.0000 g | PACK | Freq: Every day | ORAL | Status: DC
  Administered 2015-07-30 – 2015-07-31 (×2): 17 g via ORAL
  Filled 2015-07-30 (×2): qty 1

## 2015-07-30 NOTE — Progress Notes (Signed)
Patient ID: Carl Bradley, male   DOB: [birth year], 25 y.o.   MRN: [id number] 1 Day Post-Op  Subjective: Feels better than prior to surgery. No complaints.  Objective: Vital signs in last 24 hours: Temp:  [97.7 F (36.5 C)-98.7 F (37.1 C)] 98.2 F (36.8 C) (07/21 0623) Pulse Rate:  [56-78] 74 (07/21 0623) Resp:  [12-18] 14 (07/21 0623) BP: (102-128)/(64-87) 118/78 mmHg (07/21 0623) SpO2:  [97 %-100 %] 100 % (07/21 0623) Last BM Date: 07/23/15  Intake/Output from previous day: 07/20 0701 - 07/21 0700 In: 3331.3 [P.O.:625; I.V.:2656.3; IV Piggyback:50] Out: 300 [Urine:300] Intake/Output this shift:    General appearance: alert, cooperative and no distress Incision/Wound: minimal induration. Appropriate thin bloody drainage.  Lab Results:   Recent Labs  07/29/15 0845  WBC 8.4  HGB 13.5  HCT 35.8*  PLT 223   BMET  Recent Labs  07/29/15 0845  NA 137  K 3.9  CL 103  CO2 27  GLUCOSE 85  BUN 8  CREATININE 0.78  CALCIUM 8.9     Studies/Results: Ct Pelvis W Contrast  07/29/2015  CLINICAL DATA:  Constipation for 3 days. Pelvic pain. Question abscess. EXAM: CT PELVIS WITH CONTRAST TECHNIQUE: Multidetector CT imaging of the pelvis was performed using the standard protocol following the bolus administration of intravenous contrast. CONTRAST:  100 ml ISOVUE-300 IOPAMIDOL (ISOVUE-300) INJECTION 61% COMPARISON:  None. FINDINGS: The patient has a rim enhancing perirectal fluid collection on the left measuring up to 1.8 cm AP by 1.2 cm transverse by 5.8 cm craniocaudal consistent with a perirectal abscess. There is mass effect on the rectum from the abscess and associated soft tissue swelling. No other abscess is identified. There is no lymphadenopathy. No focal bony abnormality is identified. IMPRESSION: Prominent left perirectal abscess as described above. Electronically Signed   By: Drusilla Kannerhomas  Dalessio M.D.   On: 07/29/2015 09:48    Anti-infectives: Anti-infectives    Start      Dose/Rate Route Frequency Ordered Stop   07/29/15 1800  piperacillin-tazobactam (ZOSYN) IVPB 3.375 g     3.375 g 12.5 mL/hr over 240 Minutes Intravenous Every 8 hours 07/29/15 1102     07/29/15 1400  piperacillin-tazobactam (ZOSYN) IVPB 3.375 g  Status:  Discontinued     3.375 g 12.5 mL/hr over 240 Minutes Intravenous Every 8 hours 07/29/15 1304 07/29/15 1305   07/29/15 1115  piperacillin-tazobactam (ZOSYN) IVPB 3.375 g     3.375 g 100 mL/hr over 30 Minutes Intravenous  Once 07/29/15 1101 07/29/15 1153      Assessment/Plan: s/p Procedure(s): IRRIGATION AND DEBRIDEMENT PERIRECTAL ABSCESS Doing well postoperatively Wound packing in place. I would leave this another 24 hours and then remove packing tomorrow. At this point he could probably be discharged home to sitz bath and oral antibiotics for approximately one week.   LOS: 1 day    Carl Bradley 07/30/2015

## 2015-07-31 MED ORDER — OXYCODONE-ACETAMINOPHEN 5-325 MG PO TABS: 1.0000 | ORAL_TABLET | ORAL | Status: DC | PRN

## 2015-07-31 MED ORDER — AMOXICILLIN-POT CLAVULANATE 875-125 MG PO TABS: 1.0000 | ORAL_TABLET | Freq: Two times a day (BID) | ORAL | Status: DC

## 2015-07-31 NOTE — Discharge Instructions (Signed)
Soak wound area in warm water three times a day.  Wear dry pad in underwear.  You may return to work 08/04/15 if it is okay with your employer to be a work with an open wound.  If not, you will have to wait until the wound is completely healed before going back to work.  Call our office [phone]) for any wound problems.  Call the office Monday to make an appointment to be see in the [facility] clinic in 2 weeks.

## 2015-07-31 NOTE — Progress Notes (Signed)
2 Days Post-Op  Subjective: No complaints  Objective: Vital signs in last 24 hours: Temp:  [97.9 F (36.6 C)-98.2 F (36.8 C)] 97.9 F (36.6 C) (07/22 0400) Pulse Rate:  [58-70] 58 (07/21 2049) Resp:  [13-16] 16 (07/22 0400) BP: (111-120)/(68-81) 119/81 mmHg (07/22 0400) SpO2:  [98 %-100 %] 100 % (07/22 0400) Last BM Date: 07/23/15  Intake/Output from previous day: 07/21 0701 - 07/22 0700 In: 980 [P.O.:980] Out: 200 [Urine:200] Intake/Output this shift:    Resp: clear to auscultation bilaterally Cardio: regular rate and rhythm GI: soft, non-tender; bowel sounds normal; no masses,  no organomegaly Male genitalia: packing in place. wound clean  Lab Results:   Recent Labs  07/29/15 0845  WBC 8.4  HGB 13.5  HCT 35.8*  PLT 223   BMET  Recent Labs  07/29/15 0845  NA 137  K 3.9  CL 103  CO2 27  GLUCOSE 85  BUN 8  CREATININE 0.78  CALCIUM 8.9   PT/INR No results for input(s): LABPROT, INR in the last 72 hours. ABG No results for input(s): PHART, HCO3 in the last 72 hours.  Invalid input(s): PCO2, PO2  Studies/Results: No results found.  Anti-infectives: Anti-infectives    Start     Dose/Rate Route Frequency Ordered Stop   07/29/15 1800  piperacillin-tazobactam (ZOSYN) IVPB 3.375 g     3.375 g 12.5 mL/hr over 240 Minutes Intravenous Every 8 hours 07/29/15 1102     07/29/15 1400  piperacillin-tazobactam (ZOSYN) IVPB 3.375 g  Status:  Discontinued     3.375 g 12.5 mL/hr over 240 Minutes Intravenous Every 8 hours 07/29/15 1304 07/29/15 1305   07/29/15 1115  piperacillin-tazobactam (ZOSYN) IVPB 3.375 g     3.375 g 100 mL/hr over 30 Minutes Intravenous  Once 07/29/15 1101 07/29/15 1153      Assessment/Plan: s/p Procedure(s): IRRIGATION AND DEBRIDEMENT PERIRECTAL ABSCESS (N/A) Advance diet  Remove packing today and cover with clean gauze. Start sitz baths Discharge home  LOS: 2 days    TOTH III,Senya Hinzman S 07/31/2015

## 2015-07-31 NOTE — Progress Notes (Signed)
Patient discharged home, all discharge medications and instructions reviewed and questions answered.

## 2015-08-02 LAB — AEROBIC/ANAEROBIC CULTURE (SURGICAL/DEEP WOUND)

## 2015-08-11 NOTE — Discharge Summary (Signed)
Physician Discharge Summary  Patient ID: Carl Bradley MRN: [id number] DOB/AGE: October 24, 1990 25 y.o.  Admit date: 07/29/2015 Discharge date: 08/11/2015  Admission Diagnoses:   Perirectal abscess HIV   Discharge Diagnoses:  Perirectal abscess HIV Principal Problem:   Perirectal abscess Active Problems:   HIV INFECTION   PROCEDURES: IRRIGATION AND DRAINAGE PERIRECTAL ABSCESS, 07/29/15, Dr. [facility] Course:   Chief Complaint:   Rectal pain, nausea and vomiting HPI: Pt reports onset of symptoms about 2 days ago.  It has become worse.  He was seen in [facility] and referred here.  He has had a gluteal abscess, before but not a perirectal abscess like he has now.  His only other illness is his HIV and he is followed by [facility] in [facility].  I cannot find the info in [facility] for this.  He does not know what  his medications are.  Will ask pharmacy to see and try to obtain today.   Work up shows: he is afebrile, VSS.  Labs OK, WBC is normal.  CTscan shows: perirectal fluid collection on the left measuring up to 1.8 cm AP by 1.2 cm transverse by 5.8 cm craniocaudal consistent with a perirectal abscess. There is mass effect on the rectum from the abscess and associated soft tissue swelling. No other abscess is identified. There is no lymphadenopathy. No focal bony abnormality is identified  Pt was seen in the ED by Dr. Johna Sheriff and taken to the OR.  Pt underwent I&D without issue.  Packing was left in place for 24 hours.  The second post op day he was seen by Dr. Carolynne Edouard and dressing was removed.  He was placed on Sitz baths and discharged home.  He is reported to be on HIV medications, but is followed at [facility].  He did not know his meds and the pharmacy did not appear to have found out what they were.  Condition on D/C:  Improved.    CBC Latest Ref Rng & Units 07/29/2015 01/21/2015 09/27/2013  WBC 4.0 - 10.5 K/uL 8.4 8.6 8.0  Hemoglobin 13.0 - 17.0  g/dL 10.2 72.5 11.2(L)  Hematocrit 39.0 - 52.0 % 35.8(L) 36.9(L) 31.1(L)  Platelets 150 - 400 K/uL 223 293 356   CMP Latest Ref Rng & Units 07/29/2015 01/21/2015 09/27/2013  Glucose 65 - 99 mg/dL 85 81 92  BUN 6 - 20 mg/dL 8 8 7   Creatinine 0.61 - 1.24 mg/dL 3.66 4.40 3.47  Sodium 135 - 145 mmol/L 137 137 138  Potassium 3.5 - 5.1 mmol/L 3.9 3.8 4.0  Chloride 101 - 111 mmol/L 103 104 100  CO2 22 - 32 mmol/L 27 28 26   Calcium 8.9 - 10.3 mg/dL 8.9 4.2(V) 8.9  Total Protein 6.0 - 8.3 g/dL - - -  Total Bilirubin 0.3 - 1.2 mg/dL - - -  Alkaline Phos 39 - 117 U/L - - -  AST 0 - 37 U/L - - -  ALT 0 - 53 U/L - - -     Disposition: 01-Home or Self Care  Discharge Instructions    Call MD for:  difficulty breathing, headache or visual disturbances    Complete by:  As directed   Call MD for:  extreme fatigue    Complete by:  As directed   Call MD for:  hives    Complete by:  As directed   Call MD for:  persistant dizziness or light-headedness    Complete by:  As  directed   Call MD for:  persistant nausea and vomiting    Complete by:  As directed   Call MD for:  redness, tenderness, or signs of infection (pain, swelling, redness, odor or green/yellow discharge around incision site)    Complete by:  As directed   Call MD for:  severe uncontrolled pain    Complete by:  As directed   Call MD for:  temperature >100.4    Complete by:  As directed   Diet - low sodium heart healthy    Complete by:  As directed   Discharge instructions    Complete by:  As directed   May shower. Sitz baths or warm tub soaks 2-3 times a day and after bm's. Keep clean dry gauze on area and change as frequently as needed   Increase activity slowly    Complete by:  As directed       Medication List    TAKE these medications   amoxicillin-clavulanate 875-125 MG tablet Commonly known as:  AUGMENTIN Take 1 tablet by mouth 2 (two) times daily.   clotrimazole-betamethasone cream Commonly known as:  LOTRISONE Apply to  affected area 2 times daily prn   oxyCODONE-acetaminophen 5-325 MG tablet Commonly known as:  PERCOCET/ROXICET Take 1-2 tablets by mouth every 4 (four) hours as needed for moderate pain.      Follow-up Information    HOXWORTH,BENJAMIN T, MD In 2 weeks.   Specialty:  General Surgery Contact information: [address] [place] [address] [phone]           Signed: Sherrie George 08/11/2015, 4:08 PM

## 2015-11-01 ENCOUNTER — Encounter (HOSPITAL_BASED_OUTPATIENT_CLINIC_OR_DEPARTMENT_OTHER): Payer: Self-pay | Admitting: *Deleted

## 2015-11-01 ENCOUNTER — Emergency Department (HOSPITAL_BASED_OUTPATIENT_CLINIC_OR_DEPARTMENT_OTHER): Payer: BLUE CROSS/BLUE SHIELD

## 2015-11-01 ENCOUNTER — Emergency Department (HOSPITAL_BASED_OUTPATIENT_CLINIC_OR_DEPARTMENT_OTHER)
Admission: EM | Admit: 2015-11-01 | Discharge: 2015-11-01 | Disposition: A | Discharge status: Home / Self Care | Payer: BLUE CROSS/BLUE SHIELD | Attending: Emergency Medicine | Admitting: Emergency Medicine

## 2015-11-01 DIAGNOSIS — R059 Cough, unspecified: Secondary | ICD-10-CM

## 2015-11-01 DIAGNOSIS — R05 Cough: Secondary | ICD-10-CM | POA: Insufficient documentation

## 2015-11-01 DIAGNOSIS — L239 Allergic contact dermatitis, unspecified cause: Secondary | ICD-10-CM | POA: Diagnosis not present

## 2015-11-01 DIAGNOSIS — F17210 Nicotine dependence, cigarettes, uncomplicated: Secondary | ICD-10-CM | POA: Insufficient documentation

## 2015-11-01 DIAGNOSIS — R21 Rash and other nonspecific skin eruption: Secondary | ICD-10-CM | POA: Diagnosis present

## 2015-11-01 MED ORDER — ONDANSETRON 8 MG PO TBDP
8.0000 mg | ORAL_TABLET | Freq: Once | ORAL | Status: DC
  Filled 2015-11-01: qty 1

## 2015-11-01 NOTE — Discharge Instructions (Signed)
Avoid deodorant. Wash your armpits daily with soap and water. And don't place any creams or ointments over the rash. If rash does not improve in the next week, contact your physician. Ask your physicians to help you to stop smoking. See your doctor in the office if cough not improved in a week. Return if your condition worsens for any reason

## 2015-11-01 NOTE — ED Provider Notes (Signed)
[facility]   CSN: [id number] Arrival date & time: 11/01/15  1451   By signing my name below, I, Avnee Patel, attest that this documentation has been prepared under the direction and in the presence of Doug Sou, MD  Electronically Signed: Clovis Pu, ED Scribe. 11/01/15. 4:03 PM.   History   Chief Complaint Chief Complaint  Patient presents with  . Cough  . Rash   .  The history is provided by the patient. No language interpreter was used.   HPI Comments:  Carl Bradley is a 25 y.o. male, with a hx of HIV, who presents to the Emergency Department complaining of itchy rash under his bilateral armpits x 2 weeks. Pt states the rash became mildly painful today. He also notes productive cough with yellow phlegm x 1 week.  No alleviating factors noted. Pt is a smoker and occasional alcohol user. He denies drug use. Pt is not followed by a PCP. No known drug allergies. No shortness of breath no fever. No other associated symptoms. Nothing makes symptoms better or worse  Past Medical History:  Diagnosis Date  . HIV disease (HCC)    Reports most recent CD4 count greater than 200. He does not know his viral load. Denies AIDS Patient Active Problem List   Diagnosis Date Noted  . Perirectal abscess 07/29/2015  . ADJUSTMENT DISORDER 11/23/2009  . CONTUSION, RIGHT HAND 11/23/2009  . HIV INFECTION 08/06/2009    Past Surgical History:  Procedure Laterality Date  . INCISION AND DRAINAGE PERIRECTAL ABSCESS N/A 07/29/2015   Procedure: IRRIGATION AND DEBRIDEMENT PERIRECTAL ABSCESS;  Surgeon: [name], MD;  Location: [facility];  Service: General;  Laterality: N/A;       Home Medications    Prior to Admission medications   Medication Sig Start Date End Date Taking? Authorizing Provider  amoxicillin-clavulanate (AUGMENTIN) 875-125 MG tablet Take 1 tablet by mouth 2 (two) times daily. 07/31/15   [name], MD  clotrimazole-betamethasone  (LOTRISONE) cream Apply to affected area 2 times daily prn Patient not taking: Reported on 07/29/2015 07/05/15   [name], PA-C  oxyCODONE-acetaminophen (PERCOCET/ROXICET) 5-325 MG tablet Take 1-2 tablets by mouth every 4 (four) hours as needed for moderate pain. 07/31/15   [name], MD    Family History No family history on file.  Social History Social History  Substance Use Topics  . Smoking status: Light Tobacco Smoker    Packs/day: 0.50    Types: Cigarettes  . Smokeless tobacco: Never Used  . Alcohol use Yes     Comment: occasional     Allergies   Review of patient's allergies indicates no known allergies.   Review of Systems Review of Systems  Constitutional: Negative.  Negative for fever.  HENT: Negative.   Respiratory: Positive for cough. Negative for shortness of breath.   Cardiovascular: Negative.   Gastrointestinal: Negative.   Musculoskeletal: Negative.   Skin: Positive for rash.  Neurological: Negative.   Psychiatric/Behavioral: Negative.   All other systems reviewed and are negative.    Physical Exam Updated Vital Signs BP 132/80 (BP Location: Left Arm)   Pulse 79   Temp 98.7 F (37.1 C) (Oral)   Resp 18   Ht 6' (1.829 m)   Wt 150 lb (68 kg)   SpO2 100%   BMI 20.34 kg/m   Physical Exam  Constitutional: He appears well-developed and well-nourished. He appears distressed.  HENT:  Head: Normocephalic and atraumatic.  Novitsky plaques on oropharynx  Eyes: Conjunctivae are normal. Pupils are equal, round, and reactive to light.  Neck: Neck supple. No tracheal deviation present. No thyromegaly present.  Cardiovascular: Normal rate and regular rhythm.   No murmur heard. Pulmonary/Chest: Effort normal and breath sounds normal.  Abdominal: Soft. Bowel sounds are normal. He exhibits no distension. There is no tenderness.  Musculoskeletal: Normal range of motion. He exhibits no edema or tenderness.  Neurological: He is alert. Coordination normal.   Skin: Skin is warm and dry. Rash noted.  Mildly reddened without exudate or by bilaterally. No cervical lymphadenopathy no swelling. No open wounds.  Psychiatric: He has a normal mood and affect.  Nursing note and vitals reviewed.    ED Treatments / Results  DIAGNOSTIC STUDIES:  Oxygen Saturation is 100% on RA, normal by my interpretation.    COORDINATION OF CARE:  4:02 PM Discussed treatment plan with pt at bedside and pt agreed to plan.  Labs (all labs ordered are listed, but only abnormal results are displayed) Labs Reviewed - No data to display  EKG  EKG Interpretation None       Radiology No results found.  Procedures Procedures (including critical care time)  Medications Ordered in ED Medications - No data to display   Initial Impression / Assessment and Plan / ED Course  I have reviewed the triage vital signs and the nursing notes.  Pertinent labs & imaging results that were available during my care of the patient were reviewed by me and considered in my medical decision making (see chart for details).  Clinical Course  Chest x-ray viewed by me Results for orders placed or performed during the hospital encounter of 07/29/15  Surgical pcr screen  Result Value Ref Range   MRSA, PCR NEGATIVE NEGATIVE   Staphylococcus aureus NEGATIVE NEGATIVE  Aerobic/Anaerobic Culture (surgical/deep wound)  Result Value Ref Range   Specimen Description ABSCESS    Special Requests NONE    Gram Stain      MODERATE WBC PRESENT,BOTH PMN AND MONONUCLEAR ABUNDANT GRAM NEGATIVE RODS FEW GRAM POSITIVE COCCI IN PAIRS Performed at [facility]    Culture      MODERATE NON-GROUPABLE BETA STREPTOCOCCUS MIXED ANAEROBIC FLORA PRESENT.  CALL LAB IF FURTHER IID REQUIRED.    Report Status 08/02/2015 FINAL   Occult blood card to lab, stool Provider will collect  Result Value Ref Range   Fecal Occult Bld NEGATIVE NEGATIVE  CBC  Result Value Ref Range   WBC 8.4 4.0 - 10.5  K/uL   RBC 4.31 4.22 - 5.81 MIL/uL   Hemoglobin 13.5 13.0 - 17.0 g/dL   HCT 40.9 (L) 81.1 - 91.4 %   MCV 83.1 78.0 - 100.0 fL   MCH 31.3 26.0 - 34.0 pg   MCHC 37.7 (H) 30.0 - 36.0 g/dL   RDW 78.2 95.6 - 21.3 %   Platelets 223 150 - 400 K/uL  Basic metabolic panel  Result Value Ref Range   Sodium 137 135 - 145 mmol/L   Potassium 3.9 3.5 - 5.1 mmol/L   Chloride 103 101 - 111 mmol/L   CO2 27 22 - 32 mmol/L   Glucose, Bld 85 65 - 99 mg/dL   BUN 8 6 - 20 mg/dL   Creatinine, Ser 0.86 0.61 - 1.24 mg/dL   Calcium 8.9 8.9 - 57.8 mg/dL   GFR calc non Af Amer >60 >60 mL/min   GFR calc Af Amer >60 >60 mL/min   Anion gap 7 5 - 15  Dg Chest 2 View  Result Date: 11/01/2015 CLINICAL DATA:  Cough for 1 week EXAM: CHEST  2 VIEW COMPARISON:  01/21/2015 FINDINGS: The heart size and mediastinal contours are within normal limits. Both lungs are clear. The visualized skeletal structures are unremarkable. IMPRESSION: No active cardiopulmonary disease. Electronically Signed   By: Alcide CleverMark  Lukens M.D.   On: 11/01/2015 16:20    5:50 PM patient resting comfortably. No distress. Rash on axilla consistent with contact dermatitis. Suggest avoid deodorant. I've suggested the patient contact his HIV doctor to be seen within the next few weeks. He does have an appointment scheduled in one month. I counseled patient for 5 minutes on smoking cessation  Final Clinical Impressions(s) / ED Diagnoses   Final diagnoses:  None  Diagnosis #1 cough #2 contact dermatitis  New Prescriptions New Prescriptions   No medications on file  I personally performed the services described in this documentation, which was scribed in my presence. The recorded information has been reviewed and considered.     Doug SouSam Elleanna Melling, MD 11/01/15 1758

## 2015-11-01 NOTE — ED Triage Notes (Signed)
Cough and rash x 2 weeks.

## 2015-11-01 NOTE — ED Notes (Signed)
Patient transported to X-ray 

## 2015-11-01 NOTE — ED Notes (Signed)
MD at bedside. 

## 2016-01-14 ENCOUNTER — Emergency Department (HOSPITAL_BASED_OUTPATIENT_CLINIC_OR_DEPARTMENT_OTHER)
Admission: EM | Admit: 2016-01-14 | Discharge: 2016-01-14 | Disposition: A | Discharge status: Home / Self Care | Payer: BLUE CROSS/BLUE SHIELD | Attending: Emergency Medicine | Admitting: Emergency Medicine

## 2016-01-14 ENCOUNTER — Encounter (HOSPITAL_BASED_OUTPATIENT_CLINIC_OR_DEPARTMENT_OTHER): Payer: Self-pay | Admitting: *Deleted

## 2016-01-14 DIAGNOSIS — Z79899 Other long term (current) drug therapy: Secondary | ICD-10-CM | POA: Diagnosis not present

## 2016-01-14 DIAGNOSIS — R21 Rash and other nonspecific skin eruption: Secondary | ICD-10-CM

## 2016-01-14 DIAGNOSIS — F17210 Nicotine dependence, cigarettes, uncomplicated: Secondary | ICD-10-CM | POA: Insufficient documentation

## 2016-01-14 DIAGNOSIS — Z21 Asymptomatic human immunodeficiency virus [HIV] infection status: Secondary | ICD-10-CM | POA: Diagnosis not present

## 2016-01-14 DIAGNOSIS — L299 Pruritus, unspecified: Secondary | ICD-10-CM | POA: Diagnosis not present

## 2016-01-14 MED ORDER — TRIAMCINOLONE ACETONIDE 0.1 % EX LOTN: 1.0000 "application " | TOPICAL_LOTION | Freq: Three times a day (TID) | CUTANEOUS | 0 refills | Status: DC

## 2016-01-14 NOTE — Discharge Instructions (Signed)
This is likely a contact dermatitis. You need to use the cream on your sites up to 2 times daily. You need take benadryl at night for itching. Use zyrtec during the day for itching. Follow up with primary care doctor if your symptoms persist for need for dermatology referral.

## 2016-01-14 NOTE — ED Triage Notes (Signed)
Patient is alert and oriented x4.  He is here to be seen for a rash in bilateral axillary and groin area that has been intermittent for over a week.  Patient denies any pain .

## 2016-01-16 NOTE — ED Provider Notes (Signed)
[facility] Provider Note   CSN: [id number] Arrival date & time: 01/14/16  1221     History   Chief Complaint Chief Complaint  Patient presents with  . Rash    HPI Carl Bradley is a 26 y.o. male.  The history is provided by the patient.  Rash   This is a new problem. The current episode started 2 days ago (bilat axiall and groin). The problem has not changed since onset.The problem is associated with nothing (dassocited with sweating in axilla and groin. Denies new detergent, soap, medication, or enviornmental exposure). There has been no fever. The rash is present on the groin. The pain is at a severity of 0/10. The patient is experiencing no pain (puritis). Associated symptoms include itching. Pertinent negatives include no pain and no weeping. He has tried anti-itch cream for the symptoms. The treatment provided mild relief.    Past Medical History:  Diagnosis Date  . HIV disease [facility](HCC)     Patient Active Problem List   Diagnosis Date Noted  . Perirectal abscess 07/29/2015  . ADJUSTMENT DISORDER 11/23/2009  . CONTUSION, RIGHT HAND 11/23/2009  . HIV INFECTION 08/06/2009    Past Surgical History:  Procedure Laterality Date  . INCISION AND DRAINAGE PERIRECTAL ABSCESS N/A 07/29/2015   Procedure: IRRIGATION AND DEBRIDEMENT PERIRECTAL ABSCESS;  Surgeon: Glenna FellowsBenjamin Hoxworth, MD;  Location: [facility];  Service: General;  Laterality: N/A;       Home Medications    Prior to Admission medications   Medication Sig Start Date End Date Taking? Authorizing Provider  amoxicillin-clavulanate (AUGMENTIN) 875-125 MG tablet Take 1 tablet by mouth 2 (two) times daily. 07/31/15   Chevis PrettyPaul Toth III, MD  clotrimazole-betamethasone (LOTRISONE) cream Apply to affected area 2 times daily prn Patient not taking: Reported on 07/29/2015 07/05/15   Arthor CaptainAbigail Harris, PA-C  oxyCODONE-acetaminophen (PERCOCET/ROXICET) 5-325 MG tablet Take 1-2 tablets by mouth every 4 (four) hours as needed for moderate  pain. 07/31/15   Chevis PrettyPaul Toth III, MD  triamcinolone lotion (KENALOG) 0.1 % Apply 1 application topically 3 (three) times daily. 01/14/16   Rise MuKenneth T Aino Heckert, PA-C    Family History History reviewed. No pertinent family history.  Social History Social History  Substance Use Topics  . Smoking status: Light Tobacco Smoker    Packs/day: 0.50    Types: Cigarettes  . Smokeless tobacco: Never Used  . Alcohol use Yes     Comment: occasional     Allergies   Patient has no known allergies.   Review of Systems Review of Systems  Constitutional: Negative for chills and fever.  Gastrointestinal: Negative for nausea and vomiting.  Skin: Positive for itching and rash.  Neurological: Negative for headaches.  All other systems reviewed and are negative.    Physical Exam Updated Vital Signs BP 138/88 (BP Location: Right Arm)   Pulse 77   Temp 98.2 F (36.8 C) (Oral)   Resp 16   Ht 6' (1.829 m)   Wt 67.1 kg   SpO2 98%   BMI 20.07 kg/m   Physical Exam  Constitutional: He appears well-developed and well-nourished. No distress.  Eyes: Right eye exhibits no discharge. Left eye exhibits no discharge. No scleral icterus.  Neck: Normal range of motion. Neck supple.  Pulmonary/Chest: No respiratory distress.  Musculoskeletal: Normal range of motion.  Neurological: He is alert.  Skin: Skin is warm and dry. Capillary refill takes less than 2 seconds. No pallor.  Pruritic maculopapular rash with vesicles to axilla bilat and suprapubic region.  No discharge. No signs of cellulitis or infection. excoriation noted.   Nursing note and vitals reviewed.    ED Treatments / Results  Labs (all labs ordered are listed, but only abnormal results are displayed) Labs Reviewed - No data to display  EKG  EKG Interpretation None       Radiology No results found.  Procedures Procedures (including critical care time)  Medications Ordered in ED Medications - No data to display   Initial  Impression / Assessment and Plan / ED Course  I have reviewed the triage vital signs and the nursing notes.  Pertinent labs & imaging results that were available during my care of the patient were reviewed by me and considered in my medical decision making (see chart for details).  Clinical Course   Patient with likely contact dermatitis in axilla and groin. Patient states he has excessive perspiration. Instructed to use unscented soaps, lotions, and detergents. Will treat with kenalog cream.  No signs of secondary infection, cellulitis, or abscess Patient is afebrile. Encouraged benadryl and zyrtec for itching. Follow up with PCP in 2-3 days. Return precautions discussed. Pt is safe for discharge at this time.    Final Clinical Impressions(s) / ED Diagnoses   Final diagnoses:  Rash    New Prescriptions Discharge Medication List as of 01/14/2016  2:44 PM    START taking these medications   Details  triamcinolone lotion (KENALOG) 0.1 % Apply 1 application topically 3 (three) times daily., Starting Fri 01/14/2016, Print         [name], PA-C 01/16/16 1610    Alvira Monday, MD 01/19/16 1124

## 2016-02-20 ENCOUNTER — Emergency Department (HOSPITAL_BASED_OUTPATIENT_CLINIC_OR_DEPARTMENT_OTHER)
Admission: EM | Admit: 2016-02-20 | Discharge: 2016-02-20 | Disposition: A | Discharge status: Home / Self Care | Payer: BLUE CROSS/BLUE SHIELD | Attending: Emergency Medicine | Admitting: Emergency Medicine

## 2016-02-20 ENCOUNTER — Encounter (HOSPITAL_BASED_OUTPATIENT_CLINIC_OR_DEPARTMENT_OTHER): Payer: Self-pay | Admitting: Emergency Medicine

## 2016-02-20 DIAGNOSIS — B20 Human immunodeficiency virus [HIV] disease: Secondary | ICD-10-CM | POA: Diagnosis not present

## 2016-02-20 DIAGNOSIS — K611 Rectal abscess: Secondary | ICD-10-CM | POA: Diagnosis present

## 2016-02-20 DIAGNOSIS — F17210 Nicotine dependence, cigarettes, uncomplicated: Secondary | ICD-10-CM | POA: Diagnosis not present

## 2016-02-20 MED ORDER — LEVOFLOXACIN 250 MG PO TABS
750.0000 mg | ORAL_TABLET | Freq: Every day | ORAL | 0 refills | Status: AC
Start: 2016-02-20 — End: 2016-02-27

## 2016-02-20 MED ORDER — LIDOCAINE-EPINEPHRINE (PF) 2 %-1:200000 IJ SOLN
10.0000 mL | Freq: Once | INTRAMUSCULAR | Status: AC
  Administered 2016-02-20: 10 mL

## 2016-02-20 MED ORDER — METRONIDAZOLE 500 MG PO TABS: 500.0000 mg | ORAL_TABLET | Freq: Three times a day (TID) | ORAL | 0 refills | Status: AC

## 2016-02-20 MED ORDER — LIDOCAINE-EPINEPHRINE (PF) 2 %-1:200000 IJ SOLN
INTRAMUSCULAR | Status: AC
  Filled 2016-02-20: qty 20

## 2016-02-20 NOTE — ED Triage Notes (Signed)
Patient states he has a boil on his buttocks for the last nine days.  States the pain has just gotten worse.

## 2016-02-20 NOTE — ED Provider Notes (Signed)
[facility]   CSN: [id number] Arrival date & time: 02/20/16  1043     History   Chief Complaint Chief Complaint  Patient presents with  . Abscess    HPI Carl Bradley is a 26 y.o. male with history of HIV, perirectal abscess presenting today to ED complaining of abscess on his right buttocks for the last 9 days. He states it is constant, sharp, unchanged, 8/10. He reports no other associations. He denies fevers, chills, nausea, vomiting, changes in bowel movements, or urinary symptoms. He states turning and pressing on area makes his pain worse. He states that when he is lying on his right side and not touching the area it doesn't hurt. He reports tyrying ibuprofen that "helps for a while" and then comes back. He states he's had something similar but it was on the inside of his buttocks and this one is more on the outside and feels different. He states he has an infectious disease physician who follows his HIV every 6 months. He states his Cd4 count was over 500 as of last month when he last saw his ID physician.   The history is provided by the patient. No language interpreter was used.    Past Medical History:  Diagnosis Date  . HIV disease [facility])     Patient Active Problem List   Diagnosis Date Noted  . Perirectal abscess 07/29/2015  . ADJUSTMENT DISORDER 11/23/2009  . CONTUSION, RIGHT HAND 11/23/2009  . HIV INFECTION 08/06/2009    Past Surgical History:  Procedure Laterality Date  . INCISION AND DRAINAGE PERIRECTAL ABSCESS N/A 07/29/2015   Procedure: IRRIGATION AND DEBRIDEMENT PERIRECTAL ABSCESS;  Surgeon: [name], MD;  Location: [facility];  Service: General;  Laterality: N/A;       Home Medications    Prior to Admission medications   Medication Sig Start Date End Date Taking? Authorizing Provider  amoxicillin-clavulanate (AUGMENTIN) 875-125 MG tablet Take 1 tablet by mouth 2 (two) times daily. 07/31/15   [name], MD    clotrimazole-betamethasone (LOTRISONE) cream Apply to affected area 2 times daily prn Patient not taking: Reported on 07/29/2015 07/05/15   [name], PA-C  levofloxacin (LEVAQUIN) 250 MG tablet Take 3 tablets (750 mg total) by mouth daily. 02/20/16 02/27/16  Tine Mabee [place], [place]  metroNIDAZOLE (FLAGYL) 500 MG tablet Take 1 tablet (500 mg total) by mouth 3 (three) times daily. 02/20/16 02/27/16  Miko Sirico Manuel [place], [place]  oxyCODONE-acetaminophen (PERCOCET/ROXICET) 5-325 MG tablet Take 1-2 tablets by mouth every 4 (four) hours as needed for moderate pain. 07/31/15   [name], MD  triamcinolone lotion (KENALOG) 0.1 % Apply 1 application topically 3 (three) times daily. 01/14/16   [name], PA-C    Family History No family history on file.  Social History Social History  Substance Use Topics  . Smoking status: Light Tobacco Smoker    Packs/day: 0.50    Types: Cigarettes  . Smokeless tobacco: Never Used  . Alcohol use Yes     Comment: occasional     Allergies   Patient has no known allergies.   Review of Systems Review of Systems  Constitutional: Negative for chills and fever.  Respiratory: Negative for shortness of breath.   Cardiovascular: Negative for chest pain.  Gastrointestinal: Negative for abdominal pain, diarrhea, nausea and vomiting.  Genitourinary: Negative for difficulty urinating and dysuria.  Skin:       Abscess to right buttock     Physical Exam Updated  Vital Signs BP 120/67 (BP Location: Right Arm)   Pulse 86   Temp 98.4 F (36.9 C) (Oral)   Resp 18   Ht 6' (1.829 m)   Wt 68.9 kg   SpO2 100%   BMI 20.61 kg/m   Physical Exam  Constitutional: He is oriented to person, place, and time. He appears well-developed and well-nourished.  Well appearing  HENT:  Head: Normocephalic and atraumatic.  Nose: Nose normal.  Eyes: Conjunctivae and EOM are normal. Pupils are equal, round, and reactive to light.  Neck: Normal range of motion.   Cardiovascular: Normal rate and normal heart sounds.   Pulmonary/Chest: Effort normal and breath sounds normal. No respiratory distress.  Normal work of breathing. No respiratory distress noted.   Abdominal: Soft.  Genitourinary:  Genitourinary Comments: Visible abscess with diameter of about 5 cm on upper right perirectal area, with slight draining. TTP. Mild discoloration on surface with no visible surrounding erythema. No visible fistula.   Rectal exam: negative without mass, lesions or tenderness, no tenderness noted.  Chaperone present.   Musculoskeletal: Normal range of motion.  Neurological: He is alert and oriented to person, place, and time.  Skin: Skin is warm.  Psychiatric: He has a normal mood and affect. His behavior is normal.  Nursing note and vitals reviewed.    ED Treatments / Results  Labs (all labs ordered are listed, but only abnormal results are displayed) Labs Reviewed - No data to display  EKG  EKG Interpretation None       Radiology No results found.  Procedures .Marland Kitchen.Incision and Drainage Date/Time: 02/20/2016 1:30 PM Performed by: Alvina ChouESPINA, Laterra Lubinski MANUEL Authorized by: Alvina ChouESPINA, Hani Campusano MANUEL   Consent:    Consent obtained:  Verbal   Consent given by:  Patient   Risks discussed:  Bleeding, incomplete drainage, infection, damage to other organs and pain   Alternatives discussed:  No treatment and delayed treatment Location:    Type:  Abscess   Size:  5 cm   Location:  Anogenital   Anogenital location:  Perirectal Pre-procedure details:    Skin preparation:  Betadine Anesthesia (see MAR for exact dosages):    Anesthesia method:  Local infiltration   Local anesthetic:  Lidocaine 2% WITH epi Procedure type:    Complexity:  Simple Procedure details:    Incision types:  Stab incision and single straight   Incision depth:  Dermal   Scalpel blade:  11   Wound management:  Probed and deloculated, irrigated with saline and extensive cleaning    Drainage:  Serosanguinous and purulent   Drainage amount:  Copious   Wound treatment:  Wound left open   Packing materials:  1/4 in gauze   Amount 1/4":  7 cm Post-procedure details:    Patient tolerance of procedure:  Tolerated well, no immediate complications     (including critical care time)  Medications Ordered in ED Medications  lidocaine-EPINEPHrine (XYLOCAINE W/EPI) 2 %-1:200000 (PF) injection 10 mL (10 mLs Infiltration Given by Other 02/20/16 1158)     Initial Impression / Assessment and Plan / ED Course  I have reviewed the triage vital signs and the nursing notes.  Pertinent labs & imaging results that were available during my care of the patient were reviewed by me and considered in my medical decision making (see chart for details).     Patient presents with presentation consistent with perirectal abscess. He has history of HIV and is seen on a regular bases, last seen by ID last  month. He has a history of perirectal abscess and had a surgical I&D done last year. No fever or chills today. On exam patient afebrile, no apparent distress, vital signs stable. Abdomen soft and nontender. Area on right upper perirectal area of gluteal cleft with discharge, likely perirectal abscess. Rectal exam is normal with no pain or discomfort. Low suspicion for pilonidal cyst, fistula, or other pathology at this time. I&D done here in ED with purulent drainage handled procedure well with no complications. The patient given strict instructions to schedule appointment with general surgery tomorrow regarding today's visit as well as his PCP. Patient given antibiotics to go home with. Patient felt better after procedure and ready for discharge. Pt in NAD, VSS, afebrile. Reasons to return immediately to emergency department discussed.    Final Clinical Impressions(s) / ED Diagnoses   Final diagnoses:  Perirectal abscess    New Prescriptions Discharge Medication List as of 02/20/2016  1:30 PM     START taking these medications   Details  levofloxacin (LEVAQUIN) 250 MG tablet Take 3 tablets (750 mg total) by mouth daily., Starting Sun 02/20/2016, Until Sun 02/27/2016, Print    metroNIDAZOLE (FLAGYL) 500 MG tablet Take 1 tablet (500 mg total) by mouth 3 (three) times daily., Starting Sun 02/20/2016, Until Sun 02/27/2016, Print         [address] [place], [place] 02/20/16 1739    Jerelyn Scott, MD 02/23/16 [phone]

## 2016-02-20 NOTE — Discharge Instructions (Signed)
Please take 1 tablet of metronidazole 3 times a day for 7 days. Please take 3 tablets of levofloxacin once a day for 7 days. Please leave packing in for at least 24 hours, then remove. Please schedule appointment with General surgery tomorrow to follow up on todays visit. It is very important that you take your antibiotics as prescribed and that you complete the course of treatment. It is very important that you wait at least 24 hours before removing packing. And it is very important that you follow up with General surgery tomorrow.   Use warm compress to the area, as well as keeping area clean and dry.   Contact a health care provider if: Your abscess is bleeding. You have pain, swelling, or redness that is getting worse. You are constipated. You feel ill. You have muscle aches or chills. You have a fever. Your symptoms return after the abscess has healed.

## 2016-02-20 NOTE — ED Notes (Signed)
Triage was completed by Doug Souheresa Rasheida Broden, RN

## 2016-02-26 IMAGING — CR DG FOOT COMPLETE 3+V*R*
3 series · 3 of 3 positions shown · non-contrast
Comparison: Right foot radiographs performed 12/14/2013

CLINICAL DATA: Status post fall down 5 stairs, with large open
abrasion at the right inner foot. Initial encounter.

EXAM:
RIGHT FOOT COMPLETE - 3+ VIEW

[t foot lat right]
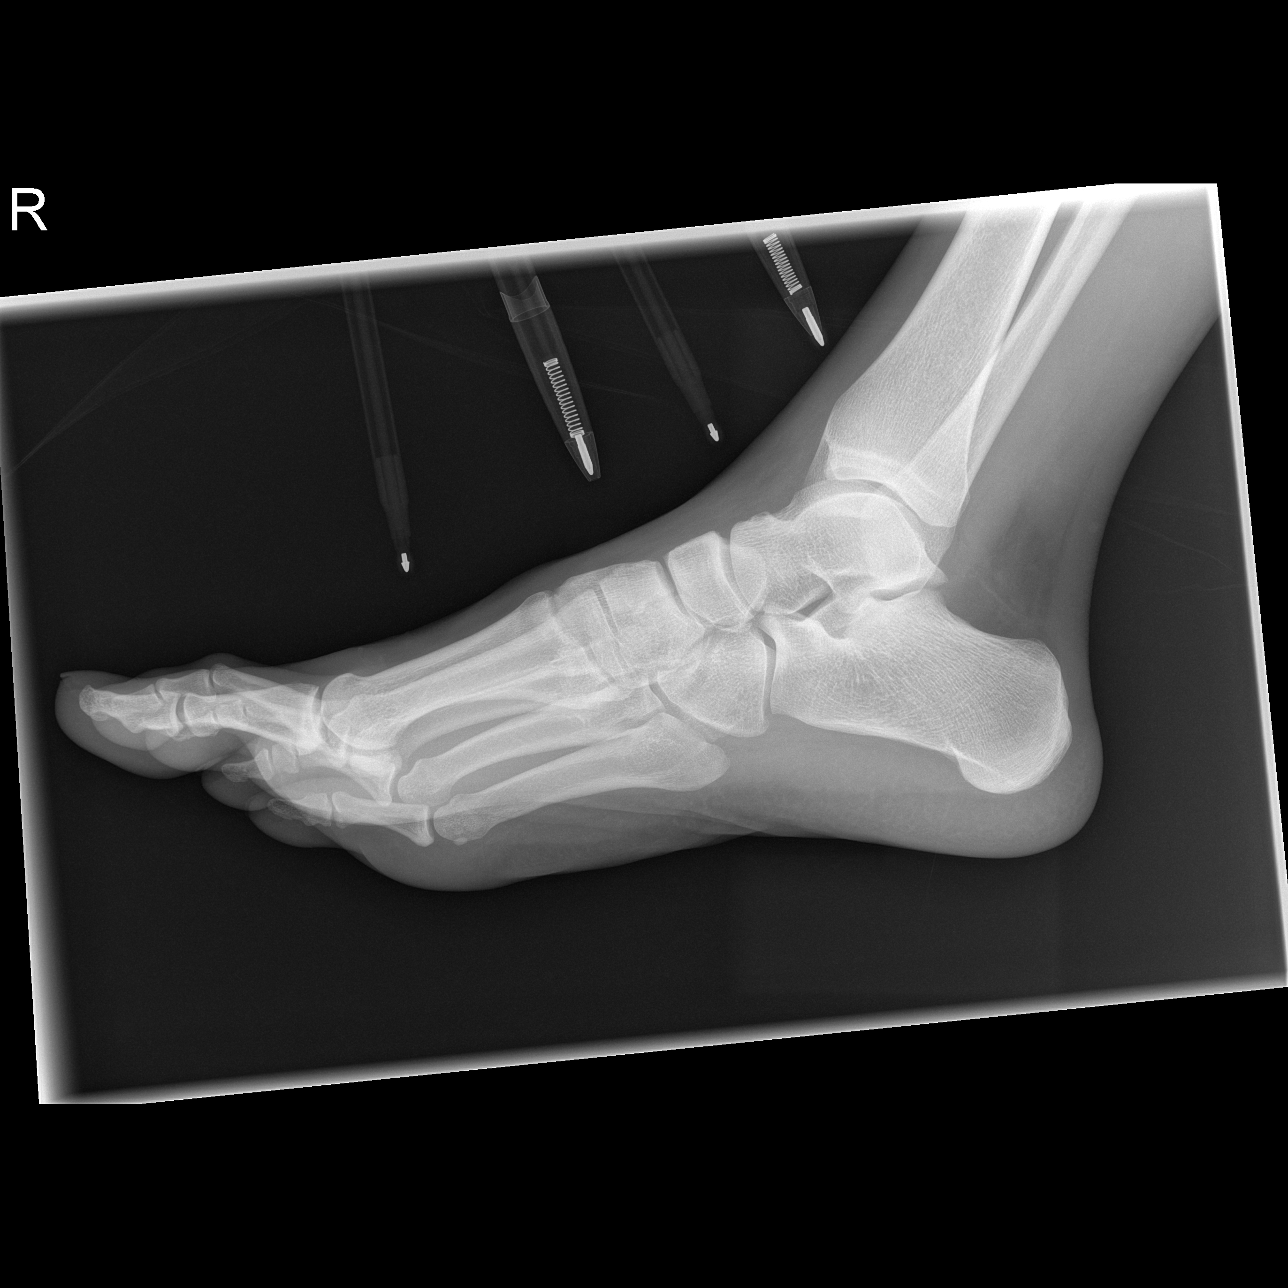

[t foot ap right]
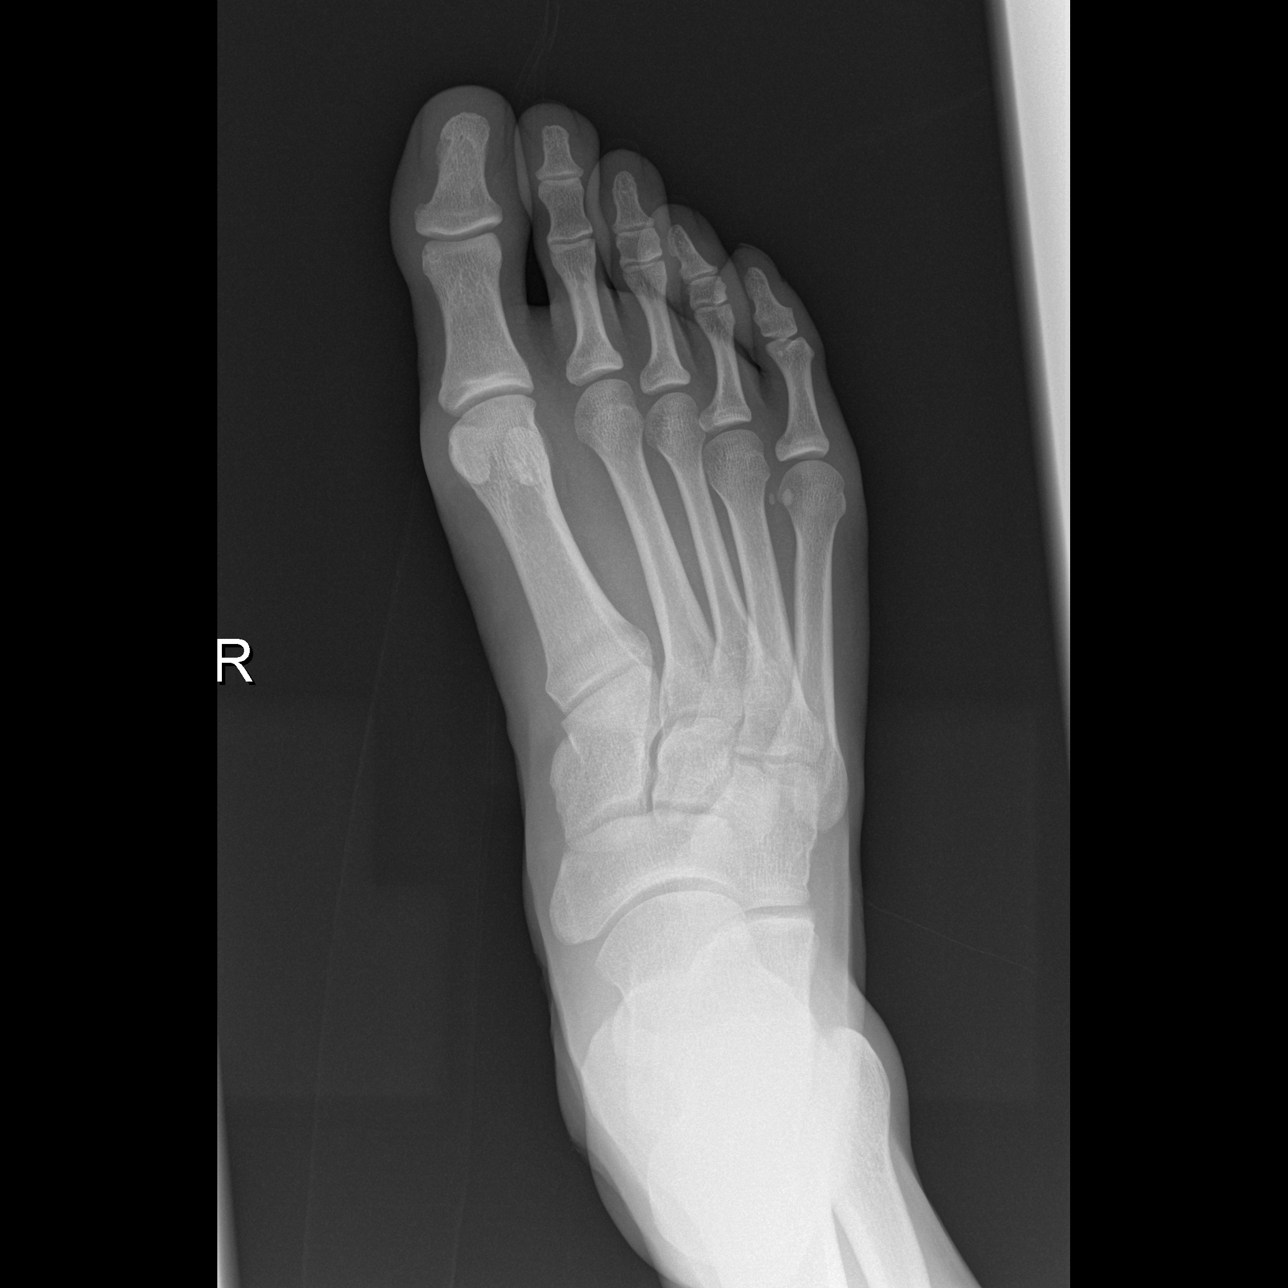

[t foot oblique right]
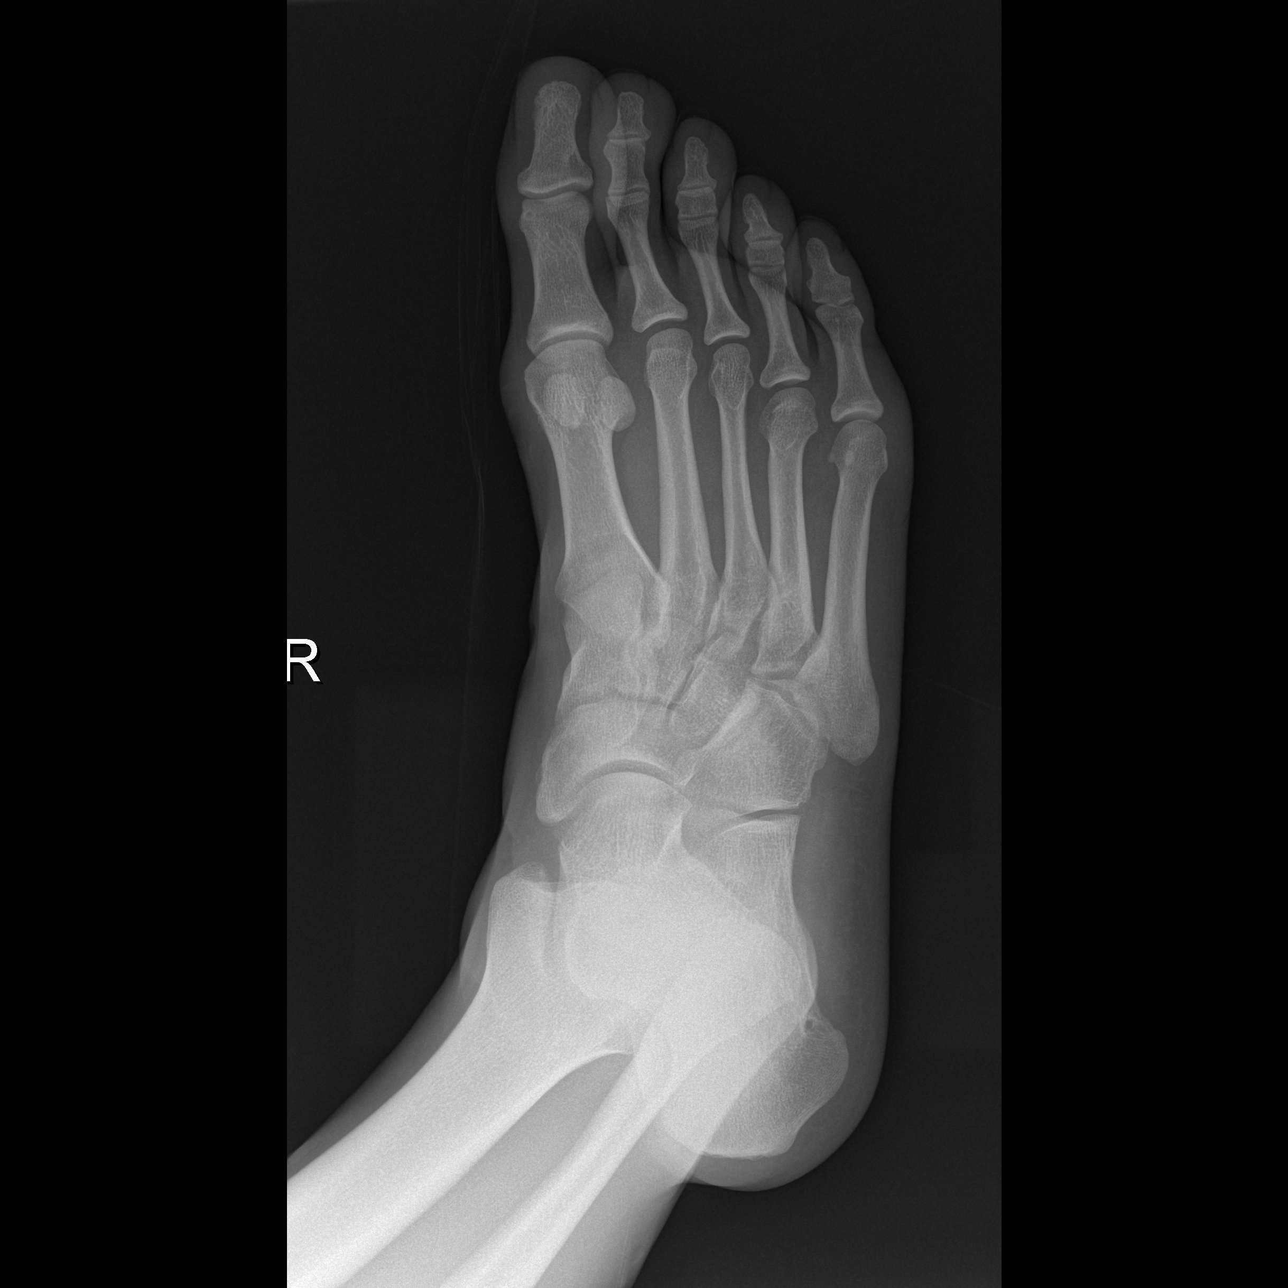

[3 of 3 positions shown; findings below may reference images not displayed]

FINDINGS: There is no evidence of fracture or dislocation. The joint spaces
are preserved. There is no evidence of talar subluxation; the
subtalar joint is unremarkable in appearance.

The known soft tissue abrasion is not well characterized on
radiograph. No radiopaque foreign bodies are seen.
IMPRESSION: No evidence of fracture or dislocation.

## 2016-05-25 ENCOUNTER — Emergency Department (HOSPITAL_BASED_OUTPATIENT_CLINIC_OR_DEPARTMENT_OTHER)
Admission: EM | Admit: 2016-05-25 | Discharge: 2016-05-26 | Disposition: A | Discharge status: Home / Self Care | Payer: BLUE CROSS/BLUE SHIELD | Attending: Emergency Medicine | Admitting: Emergency Medicine

## 2016-05-25 ENCOUNTER — Emergency Department (HOSPITAL_BASED_OUTPATIENT_CLINIC_OR_DEPARTMENT_OTHER): Payer: BLUE CROSS/BLUE SHIELD

## 2016-05-25 ENCOUNTER — Encounter (HOSPITAL_BASED_OUTPATIENT_CLINIC_OR_DEPARTMENT_OTHER): Payer: Self-pay

## 2016-05-25 DIAGNOSIS — J189 Pneumonia, unspecified organism: Secondary | ICD-10-CM | POA: Diagnosis not present

## 2016-05-25 DIAGNOSIS — Z79899 Other long term (current) drug therapy: Secondary | ICD-10-CM | POA: Diagnosis not present

## 2016-05-25 DIAGNOSIS — F17210 Nicotine dependence, cigarettes, uncomplicated: Secondary | ICD-10-CM | POA: Diagnosis not present

## 2016-05-25 DIAGNOSIS — B20 Human immunodeficiency virus [HIV] disease: Secondary | ICD-10-CM | POA: Insufficient documentation

## 2016-05-25 DIAGNOSIS — J181 Lobar pneumonia, unspecified organism: Secondary | ICD-10-CM

## 2016-05-25 DIAGNOSIS — J029 Acute pharyngitis, unspecified: Secondary | ICD-10-CM | POA: Diagnosis present

## 2016-05-25 LAB — RAPID STREP SCREEN (MED CTR MEBANE ONLY): STREPTOCOCCUS, GROUP A SCREEN (DIRECT): NEGATIVE

## 2016-05-25 MED ORDER — IBUPROFEN 100 MG/5ML PO SUSP
600.0000 mg | Freq: Once | ORAL | Status: AC
  Administered 2016-05-25: 600 mg via ORAL

## 2016-05-25 MED ORDER — LEVOFLOXACIN IN D5W 500 MG/100ML IV SOLN
500.0000 mg | Freq: Once | INTRAVENOUS | Status: AC
  Administered 2016-05-25: 500 mg via INTRAVENOUS
  Filled 2016-05-25: qty 100

## 2016-05-25 MED ORDER — IBUPROFEN 100 MG/5ML PO SUSP
ORAL | Status: AC
  Filled 2016-05-25: qty 30

## 2016-05-25 MED ORDER — IBUPROFEN 400 MG PO TABS: 600.0000 mg | ORAL_TABLET | Freq: Once | ORAL | Status: DC

## 2016-05-25 NOTE — ED Provider Notes (Signed)
[facility] Provider Note   CSN: [id number] Arrival date & time: 05/25/16  2104  By signing my name below, I, Carl Bradley, attest that this documentation has been prepared under the direction and in the presence of [name], PA-C. Electronically Signed: Cynda Bradley, Scribe. 05/25/16. 11:56 PM.  History   Chief Complaint Chief Complaint  Patient presents with  . Sore Throat    HPI Comments: Carl Bradley is a 26 y.o. male with a history of HIV, who presents to the Emergency Department complaining of sudden-onset, persistent sore throat that began yesterday. Patient denies any recent sick contacts. Patient reports an associated productive cough, and congestion. Patient reports using chloraseptic and lozenges with no relief. Patient denies any shortness of breath, fever, chills, nausea, vomiting, or abdominal pain. States he takes retrovirals twice per day, however does not know the name. He is also unaware of what physician or office he goes to the provides them. He is unaware of his CD4 count, states it has been a while since it was tested.   The history is provided by the patient. No language interpreter was used.    Past Medical History:  Diagnosis Date  . HIV disease [facility])     Patient Active Problem List   Diagnosis Date Noted  . Perirectal abscess 07/29/2015  . ADJUSTMENT DISORDER 11/23/2009  . CONTUSION, RIGHT HAND 11/23/2009  . HIV INFECTION 08/06/2009    Past Surgical History:  Procedure Laterality Date  . INCISION AND DRAINAGE PERIRECTAL ABSCESS N/A 07/29/2015   Procedure: IRRIGATION AND DEBRIDEMENT PERIRECTAL ABSCESS;  Surgeon: [name], MD;  Location: [facility];  Service: General;  Laterality: N/A;       Home Medications    Prior to Admission medications   Medication Sig Start Date End Date Taking? Authorizing Provider  amoxicillin-clavulanate (AUGMENTIN) 875-125 MG tablet Take 1 tablet by mouth 2 (two) times daily. 07/31/15   [name], MD  clotrimazole-betamethasone (LOTRISONE) cream Apply to affected area 2 times daily prn Patient not taking: Reported on 07/29/2015 07/05/15   [name], PA-C  levofloxacin (LEVAQUIN) 500 MG tablet Take 1 tablet (500 mg total) by mouth daily. 05/26/16 06/05/16  Russo, [place] N, PA-C  oxyCODONE-acetaminophen (PERCOCET/ROXICET) 5-325 MG tablet Take 1-2 tablets by mouth every 4 (four) hours as needed for moderate pain. 07/31/15   [name], MD  triamcinolone lotion (KENALOG) 0.1 % Apply 1 application topically 3 (three) times daily. 01/14/16   [name], PA-C    Family History No family history on file.  Social History Social History  Substance Use Topics  . Smoking status: Light Tobacco Smoker    Packs/day: 0.50    Types: Cigarettes  . Smokeless tobacco: Never Used  . Alcohol use Yes     Comment: occasional     Allergies   Patient has no known allergies.   Review of Systems Review of Systems  Constitutional: Negative for chills and fever.  HENT: Positive for sore throat.   Respiratory: Positive for cough. Negative for shortness of breath.   Cardiovascular: Negative for chest pain.  Gastrointestinal: Negative for abdominal pain, nausea and vomiting.  Genitourinary: Negative for dysuria.  Musculoskeletal: Negative for myalgias.  Allergic/Immunologic: Positive for immunocompromised state.  Neurological: Negative for headaches.  Hematological: Negative for adenopathy.     Physical Exam Updated Vital Signs BP 138/90 (BP Location: Right Arm)   Pulse 93   Temp 98.5 F (36.9 C) (Oral)   Resp 18   Ht  6' (1.829 m)   Wt 150 lb (68 kg)   SpO2 99%   BMI 20.34 kg/m   Physical Exam  Constitutional: He is oriented to person, place, and time. He appears well-developed and well-nourished. No distress.  Resting comfortably in bed  HENT:  Head: Normocephalic and atraumatic.  Right Ear: Tympanic membrane, external ear and ear canal normal.  Left Ear: Tympanic  membrane, external ear and ear canal normal.  Nose: Nose normal.  Mouth/Throat: Uvula is midline. No trismus in the jaw. No uvula swelling. Posterior oropharyngeal erythema (mild) present. No tonsillar exudate.  Eyes: Conjunctivae and EOM are normal. Pupils are equal, round, and reactive to light.  Neck: Normal range of motion. Neck supple.  Cardiovascular: Normal rate, regular rhythm, normal heart sounds and intact distal pulses.  Exam reveals no gallop and no friction rub.   No murmur heard. Pulmonary/Chest: Effort normal. No stridor. No respiratory distress. He has no wheezes. He has no rales.  Rhonchi in the right lung fields  Abdominal: Soft. Bowel sounds are normal. He exhibits no distension. There is no tenderness. There is no rebound and no guarding.  Musculoskeletal: Normal range of motion.  Lymphadenopathy:    He has no cervical adenopathy.  Neurological: He is alert and oriented to person, place, and time.  Skin: Skin is warm.  Psychiatric: He has a normal mood and affect. His behavior is normal.  Nursing note and vitals reviewed.    ED Treatments / Results  DIAGNOSTIC STUDIES: Oxygen Saturation is 99% on RA, normal by my interpretation.    COORDINATION OF CARE: 11:56 PM Discussed treatment plan with pt at bedside and pt agreed to plan, which includes antibiotics.   Labs (all labs ordered are listed, but only abnormal results are displayed) Labs Reviewed  COMPREHENSIVE METABOLIC PANEL - Abnormal; Notable for the following:       Result Value   Calcium 8.7 (*)    All other components within normal limits  CBC WITH DIFFERENTIAL/PLATELET - Abnormal; Notable for the following:    RBC 3.87 (*)    Hemoglobin 11.5 (*)    HCT 31.3 (*)    MCHC 36.7 (*)    All other components within normal limits  RAPID STREP SCREEN (NOT AT [facility])  CULTURE, GROUP A STREP ([facility])  CULTURE, BLOOD (ROUTINE X 2)  CULTURE, BLOOD (ROUTINE X 2)  T-HELPER CELLS (CD4) COUNT (NOT AT [facility])  I-STAT  CG4 LACTIC ACID, ED    EKG  EKG Interpretation None       Radiology Dg Chest 2 View  Result Date: 05/25/2016 CLINICAL DATA:  Coughing.  Difficulty swallowing.  Sore throat. EXAM: CHEST  2 VIEW COMPARISON:  11/01/2015 FINDINGS: There is patchy consolidation in the posterior, central right upper lobe consistent with pneumonia. Remainder of the lungs is clear. No pleural effusion or pneumothorax. The heart, mediastinum and hila are unremarkable. Skeletal structures are within normal limits. IMPRESSION: Right upper lobe pneumonia. Electronically Signed   By: Amie Portland M.D.   On: 05/25/2016 21:47    Procedures Procedures (including critical care time)  Medications Ordered in ED Medications  ibuprofen (ADVIL,MOTRIN) 100 MG/5ML suspension 600 mg (600 mg Oral Given 05/25/16 2133)  levofloxacin (LEVAQUIN) IVPB 500 mg (0 mg Intravenous Stopped 05/26/16 0052)     Initial Impression / Assessment and Plan / ED Course  I have reviewed the triage vital signs and the nursing notes.  Pertinent labs & imaging results that were available during my care of the  patient were reviewed by me and considered in my medical decision making (see chart for details).     Patient has been diagnosed with pneumonia via chest xray. Pt w HIV, no recent CD4 count. Pt not in respiratory distress, Tmax 100.5, improved with Advil. Levofloxacin given in ED. Lactic acid wnl, CMP wnl, strep neg, CBC wnl, CD4 and blood cultures x2 pending. On re-eval, pt sleeping comfortable, not in respiratory distress. Afebrile, hemodynamically stable, nontoxic prior to arrival. Will send with Levofloxacin and ID referral for f/u in 3 days. Expressed importance of f/u and taking all of abx as prescribed. Strict return precautions given and pt verbalized understanding.  Patient discussed with Dr. Rush Landmarkegeler and Dr. Read DriversMolpus, who agrees with care plan.  Discussed results, findings, treatment and follow up. Patient advised of return  precautions. Patient verbalized understanding and agreed with plan.   Final Clinical Impressions(s) / ED Diagnoses   Final diagnoses:  Community acquired pneumonia of right upper lobe of lung (HCC)    New Prescriptions New Prescriptions   LEVOFLOXACIN (LEVAQUIN) 500 MG TABLET    Take 1 tablet (500 mg total) by mouth daily.   I personally performed the services described in this documentation, which was scribed in my presence. The recorded information has been reviewed and is accurate.    Russo, [place] N, PA-C 05/26/16 0147    Tegeler, Canary Brimhristopher J, MD 05/26/16 1425

## 2016-05-25 NOTE — ED Triage Notes (Signed)
Pt c/o sore throat since yesterday with fever that started tonight, has been using throat spray and losenges without relief, c/o cough and mucous buildup

## 2016-05-25 NOTE — ED Notes (Signed)
ED Provider at bedside. 

## 2016-05-26 LAB — CBC WITH DIFFERENTIAL/PLATELET
Basophils Absolute: 0 10*3/uL (ref 0.0–0.1)
Basophils Relative: 0 %
EOS ABS: 0.1 10*3/uL (ref 0.0–0.7)
EOS PCT: 2 %
HEMATOCRIT: 31.3 % — AB (ref 39.0–52.0)
HEMOGLOBIN: 11.5 g/dL — AB (ref 13.0–17.0)
LYMPHS ABS: 0.9 10*3/uL (ref 0.7–4.0)
LYMPHS PCT: 10 %
MCH: 29.7 pg (ref 26.0–34.0)
MCHC: 36.7 g/dL — AB (ref 30.0–36.0)
MCV: 80.9 fL (ref 78.0–100.0)
MONOS PCT: 10 %
Monocytes Absolute: 0.8 10*3/uL (ref 0.1–1.0)
Neutro Abs: 6.7 10*3/uL (ref 1.7–7.7)
Neutrophils Relative %: 79 %
Platelets: 345 10*3/uL (ref 150–400)
RBC: 3.87 MIL/uL — ABNORMAL LOW (ref 4.22–5.81)
RDW: 13.5 % (ref 11.5–15.5)
WBC: 8.6 10*3/uL (ref 4.0–10.5)

## 2016-05-26 LAB — COMPREHENSIVE METABOLIC PANEL
ALBUMIN: 3.5 g/dL (ref 3.5–5.0)
ALT: 20 U/L (ref 17–63)
AST: 29 U/L (ref 15–41)
Alkaline Phosphatase: 91 U/L (ref 38–126)
Anion gap: 12 (ref 5–15)
BUN: 10 mg/dL (ref 6–20)
CHLORIDE: 103 mmol/L (ref 101–111)
CO2: 24 mmol/L (ref 22–32)
Calcium: 8.7 mg/dL — ABNORMAL LOW (ref 8.9–10.3)
Creatinine, Ser: 1.01 mg/dL (ref 0.61–1.24)
GFR calc Af Amer: >60 mL/min (ref >60)
GLUCOSE: 95 mg/dL (ref 65–99)
POTASSIUM: 3.7 mmol/L (ref 3.5–5.1)
SODIUM: 139 mmol/L (ref 135–145)
Total Bilirubin: 0.6 mg/dL (ref 0.3–1.2)
Total Protein: 8 g/dL (ref 6.5–8.1)

## 2016-05-26 LAB — I-STAT CG4 LACTIC ACID, ED: Lactic Acid, Venous: 0.76 mmol/L (ref 0.5–1.9)

## 2016-05-26 LAB — T-HELPER CELLS (CD4) COUNT (NOT AT ARMC)
CD4 % Helper T Cell: 6 % — ABNORMAL LOW (ref 33–55)
CD4 T Cell Abs: 50 /uL — ABNORMAL LOW (ref 400–2700)

## 2016-05-26 MED ORDER — LEVOFLOXACIN 500 MG PO TABS: 500.0000 mg | ORAL_TABLET | Freq: Every day | ORAL | 0 refills | Status: AC

## 2016-05-26 NOTE — Discharge Instructions (Signed)
Please read instructions below.  Take your antibiotic, Levofloxacin (levaquin), once daily until gone. It is important that you follow up with a primary care provider or your infectious disease physician in the next few days. You will hear from the hospital if your pending lab results come back abnormal.  You can take tylenol or advil as needed for pain.  Return to the ER sooner if your symptoms worsen, shortness of breath, difficulty breathing, or other concerning symptoms.

## 2016-05-28 LAB — CULTURE, GROUP A STREP (THRC)

## 2016-05-31 LAB — CULTURE, BLOOD (ROUTINE X 2)
Culture: NO GROWTH
Culture: NO GROWTH
SPECIAL REQUESTS: ADEQUATE
Special Requests: ADEQUATE

## 2016-10-17 ENCOUNTER — Telehealth: Payer: Self-pay

## 2016-10-17 NOTE — Telephone Encounter (Signed)
Message left on Voicemail Berniece Andreas with [facility] is calling regarding this patient who is having problems with thrush. He is a know HIV positive who fell out of care.  She is requesting a walk in appointment. [phone]    Patient's phone :  [phone]  I will contact patient with appointment.   Laurell Josephs, RN   I called the patient there was no answer. Left voicemail.   Laurell Josephs, RN

## 2016-10-23 NOTE — Telephone Encounter (Signed)
Patient's mother is calling to schedule new patient appointment. She states her soon seems to be having memory loss and his thrush is really bad.  She was not aware he was not returning out call.   Patient has been scheduled for labs and a new patient office visit. I advised his Mother to take him to urgent care for his acute problem.   Laurell Josephs, RN

## 2016-10-24 ENCOUNTER — Ambulatory Visit: Payer: Self-pay

## 2016-10-24 ENCOUNTER — Encounter: Payer: Self-pay | Admitting: Infectious Diseases

## 2016-10-24 ENCOUNTER — Encounter: Payer: Self-pay | Admitting: *Deleted

## 2016-10-24 ENCOUNTER — Other Ambulatory Visit: Payer: Self-pay | Admitting: Pharmacist

## 2016-10-24 ENCOUNTER — Ambulatory Visit (INDEPENDENT_AMBULATORY_CARE_PROVIDER_SITE_OTHER): Payer: Self-pay | Admitting: Infectious Diseases

## 2016-10-24 ENCOUNTER — Other Ambulatory Visit: Payer: Self-pay

## 2016-10-24 VITALS — BP 125/80 | HR 94 | Temp 97.6°F | Wt 148.0 lb

## 2016-10-24 DIAGNOSIS — Z792 Long term (current) use of antibiotics: Secondary | ICD-10-CM

## 2016-10-24 DIAGNOSIS — Z720 Tobacco use: Secondary | ICD-10-CM

## 2016-10-24 DIAGNOSIS — Z0000 Encounter for general adult medical examination without abnormal findings: Secondary | ICD-10-CM

## 2016-10-24 DIAGNOSIS — Z21 Asymptomatic human immunodeficiency virus [HIV] infection status: Secondary | ICD-10-CM

## 2016-10-24 DIAGNOSIS — Z113 Encounter for screening for infections with a predominantly sexual mode of transmission: Secondary | ICD-10-CM

## 2016-10-24 DIAGNOSIS — B20 Human immunodeficiency virus [HIV] disease: Secondary | ICD-10-CM

## 2016-10-24 DIAGNOSIS — B370 Candidal stomatitis: Secondary | ICD-10-CM | POA: Insufficient documentation

## 2016-10-24 DIAGNOSIS — F4320 Adjustment disorder, unspecified: Secondary | ICD-10-CM

## 2016-10-24 HISTORY — DX: Tobacco use: Z72.0

## 2016-10-24 MED ORDER — FLUCONAZOLE 200 MG PO TABS: 200.0000 mg | ORAL_TABLET | Freq: Every day | ORAL | 1 refills | Status: AC

## 2016-10-24 MED ORDER — SULFAMETHOXAZOLE-TRIMETHOPRIM 800-160 MG PO TABS: 1.0000 | ORAL_TABLET | Freq: Every day | ORAL | 3 refills | Status: DC

## 2016-10-24 MED ORDER — AZITHROMYCIN 600 MG PO TABS: 1200.0000 mg | ORAL_TABLET | ORAL | 3 refills | Status: DC

## 2016-10-24 MED ORDER — SULFAMETHOXAZOLE-TRIMETHOPRIM 400-80 MG PO TABS: 1.0000 | ORAL_TABLET | Freq: Every day | ORAL | 0 refills | Status: DC

## 2016-10-24 MED ORDER — FLUCONAZOLE 100 MG PO TABS: 100.0000 mg | ORAL_TABLET | Freq: Every day | ORAL | 0 refills | Status: DC

## 2016-10-24 MED ORDER — AZITHROMYCIN 600 MG PO TABS: 1200.0000 mg | ORAL_TABLET | ORAL | 0 refills | Status: DC

## 2016-10-24 MED FILL — FLUCONAZOLE 100 MG TABLET: 100 | 21 days supply | Qty: 21 | Fill #0

## 2016-10-24 MED FILL — SULFAMETHOXAZOLE/TMP SS TAB: 400-80 | 30 days supply | Qty: 30 | Fill #0

## 2016-10-24 MED FILL — AZITHROMYCIN 600 MG TABLET: 600 | 28 days supply | Qty: 8 | Fill #0

## 2016-10-24 NOTE — Progress Notes (Signed)
Patient Active Problem List   Diagnosis Date Noted  . Tobacco use 10/24/2016  . Healthcare maintenance 10/24/2016  . Oral thrush 10/24/2016  . Perirectal abscess 07/29/2015  . Adjustment reaction 11/23/2009  . AIDS (acquired immune deficiency syndrome) ([place]) 08/06/2009    Patient's Medications  New Prescriptions   AZITHROMYCIN (ZITHROMAX) 600 MG TABLET    Take 2 tablets (1,200 mg total) by mouth once a week.   AZITHROMYCIN (ZITHROMAX) 600 MG TABLET    Take 2 tablets (1,200 mg total) by mouth every 7 (seven) days.   FLUCONAZOLE (DIFLUCAN) 100 MG TABLET    Take 1 tablet (100 mg total) by mouth daily.   FLUCONAZOLE (DIFLUCAN) 200 MG TABLET    Take 1 tablet (200 mg total) by mouth daily.   SULFAMETHOXAZOLE-TRIMETHOPRIM (BACTRIM DS,SEPTRA DS) 800-160 MG TABLET    Take 1 tablet by mouth daily.   SULFAMETHOXAZOLE-TRIMETHOPRIM (BACTRIM) 400-80 MG TABLET    Take 1 tablet by mouth daily.  Previous Medications   CLOTRIMAZOLE-BETAMETHASONE (LOTRISONE) CREAM    Apply to affected area 2 times daily prn   OXYCODONE-ACETAMINOPHEN (PERCOCET/ROXICET) 5-325 MG TABLET    Take 1-2 tablets by mouth every 4 (four) hours as needed for moderate pain.   TRIAMCINOLONE LOTION (KENALOG) 0.1 %    Apply 1 application topically 3 (three) times daily.  Modified Medications   No medications on file  Discontinued Medications   AMOXICILLIN-CLAVULANATE (AUGMENTIN) 875-125 MG TABLET    Take 1 tablet by mouth 2 (two) times daily.    Subjective: Carl Bradley is here today for his first visit for HIV care. History was obtained by the patient and his mother during encounter. He presented to the waiting room with his mother asking to have him checked out before his appointment due to severe thrush.   HIV = First diagnosed in 2011 after being tested to donate blood in high school; HIV RNA in 10/2009 with VL 197,000. Has never been on medications or in care for his infection. Went to the ER 5 months ago, diagnosed with  pneumonia and rx for Levaquin was given. CD4 count drawn at that time and was only 50. Cough has since subsided. He has not followed up with ID until now and has not been on prophylactic medications. Denies fevers, abdominal pain, shortness of breath, cough, diarrhea or swollen lymph nodes. Does have occasional headaches and some blurry vision on rare occasion. Has also had some severe eczema rashes on his thighs and recent issues with recurrent abscesses requiring drainage. He is currently not sexually active and has not been for 3-4 years per his account. In [employer] he had both male and male partners, now exclusively male partners. Condom use was infrequent. No history of other STDs and tells me he has been tested in the past including recent rectal swab.   Of note his mother is very concerned about his cognition - feels he is very forgetful lately.   Oral Candidiasis =  Primary complaint today is his thrush; was given nystatin rinse but is not effective. Mother reports weight loss, low energy although [name] does not endorse this personally. Difficulty and pain with swallowing.   Health Maintenance = childhood vaccines up to date per mother's account. Not eating well d/t #2.   Review of Systems: Review of Systems  Constitutional: Positive for malaise/fatigue and weight loss. Negative for chills and fever.  HENT: Positive for sore throat.   Eyes: Positive for blurred vision.  Respiratory: Negative for  cough, sputum production and shortness of breath.   Cardiovascular: Positive for leg swelling. Negative for chest pain and orthopnea.  Gastrointestinal: Negative for abdominal pain, blood in stool, diarrhea and vomiting.  Genitourinary: Negative for dysuria.  Musculoskeletal: Negative for joint pain, myalgias and neck pain.  Skin: Positive for itching and rash.  Neurological: Positive for headaches.  Psychiatric/Behavioral: Positive for memory loss. Negative for depression and substance abuse.  The patient is not nervous/anxious.     Past Medical History:  Diagnosis Date  . HIV disease ([place])   . Tobacco use 10/24/2016    Social History  Substance Use Topics  . Smoking status: Light Tobacco Smoker    Packs/day: 0.50    Types: Cigarettes  . Smokeless tobacco: Never Used  . Alcohol use Yes     Comment: occasional    No family history on file.  No Known Allergies   Objective: Physical Exam  Constitutional: He is oriented to person, place, and time. Vital signs are normal.  Thin chronically ill appearing young male accompanied by his mother.   HENT:  Mouth/Throat: Oropharyngeal exudate present.  Large thick [place]/yellow coating over tongue, buccal surfaces and roof of mouth. Unable to open mouth fully.   Eyes: No scleral icterus.  Cardiovascular: Normal rate, regular rhythm and normal heart sounds.   Pulmonary/Chest: Effort normal and breath sounds normal. No respiratory distress.  Abdominal: Soft. Bowel sounds are normal. He exhibits no distension. There is no tenderness.  Lymphadenopathy:    He has no cervical adenopathy.  Neurological: He is alert and oriented to person, place, and time.  Skin: Skin is warm and dry. No rash noted. He is not diaphoretic.  Psychiatric: Mood and affect normal.    Lab Results Lab Results  Component Value Date   WBC 8.6 05/25/2016   HGB 11.5 (L) 05/25/2016   HCT 31.3 (L) 05/25/2016   MCV 80.9 05/25/2016   PLT 345 05/25/2016    Lab Results  Component Value Date   CREATININE 1.01 05/25/2016   BUN 10 05/25/2016   NA 139 05/25/2016   K 3.7 05/25/2016   CL 103 05/25/2016   CO2 24 05/25/2016    Lab Results  Component Value Date   ALT 20 05/25/2016   AST 29 05/25/2016   ALKPHOS 91 05/25/2016   BILITOT 0.6 05/25/2016    HIV 1 RNA Quant (copies/mL)  Date Value  11/02/2009 197000 (H)   CD4 T Cell Abs  Date Value  05/25/2016 50 /uL (L)  11/02/2009 160 cmm (L)    I have reviewed all available documents of his medical  record.    Assessment and Plan:  Problem List Items Addressed This Visit      Digestive   Oral thrush    Start prolonged diflucan today (3 weeks) with presumed esophageal involvement.       Relevant Medications   fluconazole (DIFLUCAN) 200 MG tablet   sulfamethoxazole-trimethoprim (BACTRIM DS,SEPTRA DS) 800-160 MG tablet   azithromycin (ZITHROMAX) 600 MG tablet   azithromycin (ZITHROMAX) 600 MG tablet   sulfamethoxazole-trimethoprim (BACTRIM) 400-80 MG tablet   fluconazole (DIFLUCAN) 100 MG tablet     Other   Adjustment reaction    Difficulty understanding scope of how sick he truly is today and often downplays symptoms. Will have him and his mother meet with Judeen Hammans - they would both benefit immensely from discussion in my opinion.       AIDS (acquired immune deficiency syndrome) ([place])    Will draw baseline  labs today for HIV infection. I would like to check cryptococcal Ag first with his c/o headaches and vision changes prior to initiating any ART as he has advanced AIDs disease and at significant risk for IRIS. Will have him come back in 2 weeks to review. I have sent in Rx for azithromycin, bactrim to [facility] outpatient pharmacy to help until ADAP completed. I began discussion and education about HIV transmission and benefit/goal of medications. Will continue with formal education. I have asked him to abstain from sex for now.        Relevant Medications   fluconazole (DIFLUCAN) 200 MG tablet   sulfamethoxazole-trimethoprim (BACTRIM DS,SEPTRA DS) 800-160 MG tablet   azithromycin (ZITHROMAX) 600 MG tablet   azithromycin (ZITHROMAX) 600 MG tablet   sulfamethoxazole-trimethoprim (BACTRIM) 400-80 MG tablet   fluconazole (DIFLUCAN) 100 MG tablet   Healthcare maintenance    No vaccines for now. Counseled about dietary options for higher calories (Ensure, etc) to help with weight gain.       Tobacco use    Other Visit Diagnoses    HIV (human immunodeficiency virus infection) ([place])     -  Primary   Relevant Medications   fluconazole (DIFLUCAN) 200 MG tablet   sulfamethoxazole-trimethoprim (BACTRIM DS,SEPTRA DS) 800-160 MG tablet   azithromycin (ZITHROMAX) 600 MG tablet   azithromycin (ZITHROMAX) 600 MG tablet   sulfamethoxazole-trimethoprim (BACTRIM) 400-80 MG tablet   fluconazole (DIFLUCAN) 100 MG tablet   Other Relevant Orders   HIV-1 RNA ultraquant reflex to gentyp+   T-helper cell (CD4)- ([facility] clinic only)   COMPLETE METABOLIC PANEL WITH GFR   CBC with Differential/Platelet   HLA B*5701   Quantiferon tb gold assay (blood)   Hepatitis A antibody, total   Hepatitis B surface antibody   Hepatitis B surface antigen   Hepatitis C antibody   Hepatitis B Core Antibody, IgM   Toxoplasma gondii antibody, IgG   Cryptococcal Ag, Ltx Scr Rflx Titer   Urine cytology ancillary only   Urine Culture   Screening for STDs (sexually transmitted diseases)       Relevant Orders   RPR   Urine cytology ancillary only   Urine Culture   Prophylactic antibiotic       Relevant Medications   fluconazole (DIFLUCAN) 200 MG tablet   sulfamethoxazole-trimethoprim (BACTRIM DS,SEPTRA DS) 800-160 MG tablet   azithromycin (ZITHROMAX) 600 MG tablet      Janene Madeira, MSN, NP-C [facility]  10/24/16 2:32 PM

## 2016-10-24 NOTE — Assessment & Plan Note (Signed)
Start prolonged diflucan today (3 weeks) with presumed esophageal involvement.

## 2016-10-24 NOTE — Assessment & Plan Note (Signed)
Difficulty understanding scope of how sick he truly is today and often downplays symptoms. Will have him and his mother meet with Cordelia Pen - they would both benefit immensely from discussion in my opinion.

## 2016-10-24 NOTE — Assessment & Plan Note (Signed)
Will draw baseline labs today for HIV infection. I would like to check cryptococcal Ag first with his c/o headaches and vision changes prior to initiating any ART as he has advanced AIDs disease and at significant risk for IRIS. Will have him come back in 2 weeks to review. I have sent in Rx for azithromycin, bactrim to [facility] outpatient pharmacy to help until ADAP completed. I began discussion and education about HIV transmission and benefit/goal of medications. Will continue with formal education. I have asked him to abstain from sex for now.

## 2016-10-24 NOTE — Patient Instructions (Signed)
Going to start you on a few things today to protect your immune system:  Fluconazole one pill once a day for 3 weeks (there is a refill available if more is needed)  Bactrim one pill once a day until we tell you to stop  Azithromycin 2 tablets once a week - may cause some stomach upset or diarrhea - take with food.   Will do labs today and see you back in 2 weeks with Judeth Cornfield and Cordelia Pen our counselor.

## 2016-10-24 NOTE — Assessment & Plan Note (Signed)
No vaccines for now. Counseled about dietary options for higher calories (Ensure, etc) to help with weight gain.

## 2016-10-25 LAB — URINE CULTURE
MICRO NUMBER:: 81152818
RESULT: NO GROWTH
SPECIMEN QUALITY:: ADEQUATE

## 2016-10-25 LAB — T-HELPER CELL (CD4) - (RCID CLINIC ONLY)
CD4 T CELL HELPER: 4 % — AB (ref 33–55)
CD4 T Cell Abs: 30 /uL — ABNORMAL LOW (ref 400–2700)

## 2016-10-25 LAB — URINE CYTOLOGY ANCILLARY ONLY
Chlamydia: NEGATIVE
NEISSERIA GONORRHEA: NEGATIVE

## 2016-10-26 ENCOUNTER — Encounter: Payer: Self-pay | Admitting: Internal Medicine

## 2016-10-30 LAB — COMPLETE METABOLIC PANEL WITH GFR
AG Ratio: 0.8 (calc) — ABNORMAL LOW (ref 1.0–2.5)
ALKALINE PHOSPHATASE (APISO): 91 U/L (ref 40–115)
ALT: 8 U/L — ABNORMAL LOW (ref 9–46)
AST: 20 U/L (ref 10–40)
Albumin: 3.7 g/dL (ref 3.6–5.1)
BILIRUBIN TOTAL: 0.4 mg/dL (ref 0.2–1.2)
BUN: 13 mg/dL (ref 7–25)
CALCIUM: 9.4 mg/dL (ref 8.6–10.3)
CHLORIDE: 98 mmol/L (ref 98–110)
CO2: 25 mmol/L (ref 20–32)
Creat: 0.92 mg/dL (ref 0.60–1.35)
GFR, Est African American: 133 mL/min/{1.73_m2} (ref >=60)
GFR, Est Non African American: 114 mL/min/{1.73_m2} (ref >=60)
GLUCOSE: 82 mg/dL (ref 65–99)
Globulin: 4.4 g/dL (calc) — ABNORMAL HIGH (ref 1.9–3.7)
Potassium: 3.8 mmol/L (ref 3.5–5.3)
SODIUM: 134 mmol/L — AB (ref 135–146)
TOTAL PROTEIN: 8.1 g/dL (ref 6.1–8.1)

## 2016-10-30 LAB — CBC WITH DIFFERENTIAL/PLATELET
Basophils Absolute: 20 cells/uL (ref 0–200)
Basophils Relative: 0.3 %
EOS PCT: 0.2 %
Eosinophils Absolute: 13 cells/uL — ABNORMAL LOW (ref 15–500)
HCT: 33.7 % — ABNORMAL LOW (ref 38.5–50.0)
Hemoglobin: 11.4 g/dL — ABNORMAL LOW (ref 13.2–17.1)
Lymphs Abs: 605 cells/uL — ABNORMAL LOW (ref 850–3900)
MCH: 27.9 pg (ref 27.0–33.0)
MCHC: 33.8 g/dL (ref 32.0–36.0)
MCV: 82.4 fL (ref 80.0–100.0)
MONOS PCT: 8.4 %
MPV: 11.3 fL (ref 7.5–12.5)
Neutro Abs: 5317 cells/uL (ref 1500–7800)
Neutrophils Relative %: 81.8 %
Platelets: 293 10*3/uL (ref 140–400)
RBC: 4.09 10*6/uL — AB (ref 4.20–5.80)
RDW: 13.2 % (ref 11.0–15.0)
TOTAL LYMPHOCYTE: 9.3 %
WBC mixed population: 546 cells/uL (ref 200–950)
WBC: 6.5 10*3/uL (ref 3.8–10.8)

## 2016-10-30 LAB — TOXOPLASMA GONDII ANTIBODY, IGG: TOXOPLASMA IGG RATIO: 13.7 [IU]/mL — AB

## 2016-10-30 LAB — HEPATITIS A ANTIBODY, TOTAL: Hepatitis A AB,Total: NONREACTIVE

## 2016-10-30 LAB — CRYPTOCOCCAL AG, LTX SCR RFLX TITER
CRYPTOCOCCAL AG SCREEN: NOT DETECTED
MICRO NUMBER: 81152805
SPECIMEN QUALITY:: ADEQUATE

## 2016-10-30 LAB — QUANTIFERON TB GOLD ASSAY (BLOOD)
Mitogen-Nil: 0.21 IU/mL
QUANTIFERON NIL VALUE: 0.02 [IU]/mL
QUANTIFERON(R)-TB GOLD: UNDETERMINED — AB
Quantiferon Tb Ag Minus Nil Value: <0 IU/mL

## 2016-10-30 LAB — HEPATITIS C ANTIBODY
Hepatitis C Ab: NONREACTIVE
SIGNAL TO CUT-OFF: 0.21 (ref <1.00)

## 2016-10-30 LAB — HEPATITIS B SURFACE ANTIGEN: HEP B S AG: NONREACTIVE

## 2016-10-30 LAB — RPR: RPR: NONREACTIVE

## 2016-10-30 LAB — HEPATITIS B CORE ANTIBODY, IGM: Hep B C IgM: NONREACTIVE

## 2016-10-30 LAB — HEPATITIS B SURFACE ANTIBODY,QUALITATIVE: HEP B S AB: REACTIVE — AB

## 2016-10-30 LAB — HLA B*5701: HLA-B*5701 w/rflx HLA-B High: NEGATIVE

## 2016-11-02 ENCOUNTER — Encounter: Payer: Self-pay | Admitting: Infectious Diseases

## 2016-11-02 ENCOUNTER — Ambulatory Visit (INDEPENDENT_AMBULATORY_CARE_PROVIDER_SITE_OTHER): Payer: Self-pay | Admitting: Infectious Diseases

## 2016-11-02 VITALS — BP 115/68 | HR 109 | Temp 98.6°F | Ht 72.0 in | Wt 137.0 lb

## 2016-11-02 DIAGNOSIS — B370 Candidal stomatitis: Secondary | ICD-10-CM

## 2016-11-02 DIAGNOSIS — B20 Human immunodeficiency virus [HIV] disease: Secondary | ICD-10-CM

## 2016-11-02 DIAGNOSIS — F4329 Adjustment disorder with other symptoms: Secondary | ICD-10-CM

## 2016-11-02 MED ORDER — BICTEGRAVIR-EMTRICITAB-TENOFOV 50-200-25 MG PO TABS: 1.0000 | ORAL_TABLET | Freq: Every day | ORAL | 5 refills | Status: DC

## 2016-11-02 MED ORDER — BICTEGRAVIR-EMTRICITAB-TENOFOV 50-200-25 MG PO TABS: 1.0000 | ORAL_TABLET | Freq: Every day | ORAL | 0 refills | Status: DC

## 2016-11-02 MED FILL — BIKTARVY 50-200-25 MG TABS: 50-200-25 | 30 days supply | Qty: 30 | Fill #0

## 2016-11-02 NOTE — Progress Notes (Signed)
Carl Bradley  [id number] December 06, 1990  Brief Narrative: First diagnosed in 2011 after being tested to donate blood in high school; HIV RNA in 10/2009 with VL 197,000 and apparently his CD4 was < 200 at that time. OI's: Oral Thrush. HIV Risk: MSM with no condom use.   Patient Active Problem List   Diagnosis Date Noted  . Tobacco use 10/24/2016  . Healthcare maintenance 10/24/2016  . Oral thrush 10/24/2016  . Adjustment reaction 11/23/2009  . AIDS (acquired immune deficiency syndrome) (HCC) 08/06/2009    HPI Carl KinLeon Fasnacht is here today for HIV follow up visit and to review labs, continue HIV education. History was obtained by the patient and his mother during encounter.   HIV = Ready to talk about medications today and understand more about HIV infection and progression/progrnosis. Endorses no complaints today suggestive of associated opportunistic infection or advancing HIV disease such as fevers, night sweats, weight loss, anorexia, cough, SOB, nausea, vomiting, diarrhea, headache, sensory changes, lymphadenopathy.   Oral Candidiasis = Reports it is improving and he is now able to eat. Much improved appetite since last visit. No further pain with eating/swallowing. Compliant with current dose of fluconazole.   Health Maintenance = childhood vaccines up to date per mother's account. Does not want to have flu vaccine today. Not currently working. Lives at home with his mother (which is recent as he was previously living with his father).    Patient's Medications  New Prescriptions   BICTEGRAVIR-EMTRICITABINE-TENOFOVIR AF (BIKTARVY) 50-200-25 MG TABS TABLET    Take 1 tablet by mouth daily. Try to take at the same time each day with or without food.  Previous Medications   AZITHROMYCIN (ZITHROMAX) 600 MG TABLET    Take 2 tablets (1,200 mg total) by mouth once a week.   AZITHROMYCIN (ZITHROMAX) 600 MG TABLET    Take 2 tablets (1,200 mg total) by mouth every 7 (seven) days.   CLOTRIMAZOLE-BETAMETHASONE (LOTRISONE) CREAM    Apply to affected area 2 times daily prn   FLUCONAZOLE (DIFLUCAN) 100 MG TABLET    Take 1 tablet (100 mg total) by mouth daily.   FLUCONAZOLE (DIFLUCAN) 200 MG TABLET    Take 1 tablet (200 mg total) by mouth daily.   SULFAMETHOXAZOLE-TRIMETHOPRIM (BACTRIM DS,SEPTRA DS) 800-160 MG TABLET    Take 1 tablet by mouth daily.   SULFAMETHOXAZOLE-TRIMETHOPRIM (BACTRIM) 400-80 MG TABLET    Take 1 tablet by mouth daily.   TRIAMCINOLONE LOTION (KENALOG) 0.1 %    Apply 1 application topically 3 (three) times daily.  Modified Medications   No medications on file  Discontinued Medications   OXYCODONE-ACETAMINOPHEN (PERCOCET/ROXICET) 5-325 MG TABLET    Take 1-2 tablets by mouth every 4 (four) hours as needed for moderate pain.    Review of Systems: Review of Systems  Constitutional: Positive for malaise/fatigue and weight loss. Negative for chills and fever.  HENT: Positive for sore throat.   Eyes: Positive for blurred vision.  Respiratory: Negative for cough, sputum production and shortness of breath.   Cardiovascular: Positive for leg swelling. Negative for chest pain and orthopnea.  Gastrointestinal: Negative for abdominal pain, blood in stool, diarrhea and vomiting.  Genitourinary: Negative for dysuria.  Musculoskeletal: Negative for joint pain, myalgias and neck pain.  Skin: Positive for itching and rash.  Neurological: Positive for headaches.  Psychiatric/Behavioral: Positive for memory loss. Negative for depression and substance abuse. The patient is not nervous/anxious.     Past Medical History:  Diagnosis Date  . HIV disease (  HCC)   . Tobacco use 10/24/2016    Social History  Substance Use Topics  . Smoking status: Light Tobacco Smoker    Packs/day: 0.50    Types: Cigarettes  . Smokeless tobacco: Never Used  . Alcohol use Yes     Comment: occasional    Family History  Problem Relation Age of Onset  . Healthy Mother   . Healthy  Father     No Known Allergies   Objective: Physical Exam  Constitutional: He is oriented to person, place, and time. Vital signs are normal.  Thin chronically ill appearing young male accompanied by his mother.   HENT:  Mouth/Throat: Oropharynx is clear and moist.  Eyes: No scleral icterus.  Cardiovascular: Normal rate, regular rhythm and normal heart sounds.   Pulmonary/Chest: Effort normal and breath sounds normal. No respiratory distress.  Abdominal: Soft. Bowel sounds are normal. He exhibits no distension. There is no tenderness.  Lymphadenopathy:    He has no cervical adenopathy.  Neurological: He is alert and oriented to person, place, and time.  Skin: Skin is warm and dry. No rash noted. He is not diaphoretic.  Psychiatric: Mood normal.  Easily distracted on his phone. Almost comes across as apathetic.    Lab Results Lab Results  Component Value Date   WBC 6.5 10/24/2016   HGB 11.4 (L) 10/24/2016   HCT 33.7 (L) 10/24/2016   MCV 82.4 10/24/2016   PLT 293 10/24/2016    Lab Results  Component Value Date   CREATININE 0.92 10/24/2016   BUN 13 10/24/2016   NA 134 (L) 10/24/2016   K 3.8 10/24/2016   CL 98 10/24/2016   CO2 25 10/24/2016    Lab Results  Component Value Date   ALT 8 (L) 10/24/2016   AST 20 10/24/2016   ALKPHOS 91 05/25/2016   BILITOT 0.4 10/24/2016    HIV 1 RNA Quant  Date Value  10/24/2016 335,000 Copies/mL (H)  11/02/2009 197000 copies/mL (H)   CD4 T Cell Abs  Date Value  10/24/2016 30 /uL (L)  05/25/2016 50 /uL (L)  11/02/2009 160 cmm (L)    Assessment and Plan:  Problem List Items Addressed This Visit      Digestive   Oral thrush    Improving. Will continue Fluconazole x 3 weeks. He will initiate Biktarvy in a few days once available. Counseled on how to prevent recurrence with ART adherence.       Relevant Medications   bictegravir-emtricitabine-tenofovir AF (BIKTARVY) 50-200-25 MG TABS tablet     Other   Adjustment reaction      I have introduced both Azzam and his mother to [place] our new clinic counselor today as his mother requested sessions. Elie denies depression, however he is very distractible in clinic with his phone, is not sleeping well. I fear he does not grasp the chronicity of his condition and poor prognosis should he continue off ART, especially as he starts to improve. He has been infected for around 8 years now and just now getting on medications.       AIDS (acquired immune deficiency syndrome) (HCC) - Primary    New patient and his mother here to establish for HIV care. I discussed with Carl Kin treatment options/side effects, benefits of treatment and long-term outcomes. I discussed how HIV is transmitted and the process of untreated HIV including increased risk for opportunistic infections, cancer, dementia and renal failure. he was counseled on routine HIV care including medication adherence, blood monitoring,  necessary vaccines and follow up visits. Counseled regarding safe sex practices including: condom use, partner disclosure, limiting partners and potential for PrEP. he spent time talking with our pharmacist Cassie regarding successful practices of ART and understands to reach out to our clinic in the future with questions. He will follow up with pharmacy team in 4 weeks to continue adherence counseling/education and reassess HIV labs.   I decided to start him on Biktarvy today; may need to consider Prezcobix/Descovy if he does not do well with adherence; however he is now living with his mother whom is helping him with pills every morning. Will need to work on independence and accountability for good habits at future visits. Continue Azithro/Bactrim for OI proph. He will receive first month through [employer] Advancing Access program while ADAP is pending.  I spent greater than 25 minutes with the patient today. Greater than 50% of the time spent face-to-face counseling and coordination of care re: HIV  and health maintenance.        Relevant Medications   bictegravir-emtricitabine-tenofovir AF (BIKTARVY) 50-200-25 MG TABS tablet   Other Relevant Orders   HIV 1 RNA quant-no reflex-bld   T-helper cell (CD4)- ([facility] clinic only)   Basic metabolic panel      Rexene Alberts, MSN, NP-C [facility]  11/03/16 9:27 AM

## 2016-11-02 NOTE — Patient Instructions (Addendum)
Continue taking your sulfamethoxazole-trimethoprim (Bactrim) one tablet once a day  Continue taking your fluconazole one tablet once a day.   Continue taking your azithromycin 2 tablets (1200 mg) once a week.   Going to start you on a new medication today called Biktarvy - take this medication once a day with or without food.   Will have you back in 4 weeks to see how you are doing.

## 2016-11-02 NOTE — Progress Notes (Signed)
HPI: Carl Bradley is a 26 y.o. male who presents to the [facility] clinic to initiate care for his newly diagnosed HIV infection.   Allergies: No Known Allergies  Past Medical History: Past Medical History:  Diagnosis Date  . HIV disease (HCC)   . Tobacco use 10/24/2016    Social History: Social History   Social History  . Marital status: Single    Spouse name: N/A  . Number of children: N/A  . Years of education: N/A   Social History Main Topics  . Smoking status: Light Tobacco Smoker    Packs/day: 0.50    Types: Cigarettes  . Smokeless tobacco: Never Used  . Alcohol use Yes     Comment: occasional  . Drug use: Yes    Types: Marijuana  . Sexual activity: Yes   Other Topics Concern  . None   Social History Narrative  . None    Current Regimen: None  Labs: HIV 1 RNA Quant  Date Value  10/24/2016 335,000 Copies/mL (H)  11/02/2009 197000 copies/mL (H)   CD4 T Cell Abs  Date Value  10/24/2016 30 /uL (L)  05/25/2016 50 /uL (L)  11/02/2009 160 cmm (L)   Hep B S Ab (no units)  Date Value  10/24/2016 REACTIVE (A)   Hepatitis B Surface Ag (no units)  Date Value  10/24/2016 NON-REACTIVE   HCV Ab (no units)  Date Value  11/02/2009 NEG    CrCl: Estimated Creatinine Clearance: 106.9 mL/min (by C-G formula based on SCr of 0.92 mg/dL).  Lipids:    Component Value Date/Time   CHOL 100 11/02/2009 2159   TRIG 105 11/02/2009 2159   HDL 30 (L) 11/02/2009 2159   CHOLHDL 3.3 Ratio 11/02/2009 2159   VLDL 21 11/02/2009 2159   LDLCALC 49 11/02/2009 2159    Assessment: Carl Bradley is here today accompanied by his mother to see Judeth CornfieldStephanie for his newly diagnosed HIV infection. We will start him on Biktarvy.  He is uninsured, so [facility] [employer] patient assistance then [facility]. I spent some time going over the medication including the importance of taking it every day and not missing any doses. I also explained common side effects  and gave him my card to call if he has any issues.  He will come back and see pharmacy in 1 month.  Plans: - Biktarvy PO once daily - F/u with pharmacy again 11/28 9am   Rastus Borton L. Roddie Riegler, PharmD, CPP Infectious Diseases Clinical Pharmacist [facility] 11/02/2016, 11:26 AM

## 2016-11-03 ENCOUNTER — Encounter: Payer: Self-pay | Admitting: Infectious Diseases

## 2016-11-03 NOTE — Assessment & Plan Note (Signed)
I have introduced both Carl Bradley and his mother to Marylu LundJanet our new clinic counselor today as his mother requested sessions. Carl Bradley denies depression, however he is very distractible in clinic with his phone, is not sleeping well. I fear he does not grasp the chronicity of his condition and poor prognosis should he continue off ART, especially as he starts to improve. He has been infected for around 8 years now and just now getting on medications.

## 2016-11-03 NOTE — Assessment & Plan Note (Signed)
New patient and his mother here to establish for HIV care. I discussed with Carl KinLeon Bradley treatment options/side effects, benefits of treatment and long-term outcomes. I discussed how HIV is transmitted and the process of untreated HIV including increased risk for opportunistic infections, cancer, dementia and renal failure. he was counseled on routine HIV care including medication adherence, blood monitoring, necessary vaccines and follow up visits. Counseled regarding safe sex practices including: condom use, partner disclosure, limiting partners and potential for PrEP. he spent time talking with our pharmacist Cassie regarding successful practices of ART and understands to reach out to our clinic in the future with questions. He will follow up with pharmacy team in 4 weeks to continue adherence counseling/education and reassess HIV labs.   I decided to start him on Biktarvy today; may need to consider Prezcobix/Descovy if he does not do well with adherence; however he is now living with his mother whom is helping him with pills every morning. Will need to work on independence and accountability for good habits at future visits. Continue Azithro/Bactrim for OI proph. He will receive first month through [employer] Advancing Access program while ADAP is pending.  I spent greater than 25 minutes with the patient today. Greater than 50% of the time spent face-to-face counseling and coordination of care re: HIV and health maintenance.

## 2016-11-03 NOTE — Assessment & Plan Note (Signed)
Improving. Will continue Fluconazole x 3 weeks. He will initiate Biktarvy in a few days once available. Counseled on how to prevent recurrence with ART adherence.

## 2016-11-04 LAB — HIV-1 RNA ULTRAQUANT REFLEX TO GENTYP+
HIV 1 RNA Quant: 335000 Copies/mL — ABNORMAL HIGH
HIV-1 RNA Quant, Log: 5.53 Log cps/mL — ABNORMAL HIGH

## 2016-11-04 LAB — RFLX HIV-1 INTEGRASE GENOTYPE: HIV-1 GENOTYPE: DETECTED — AB

## 2016-11-07 ENCOUNTER — Ambulatory Visit: Payer: BLUE CROSS/BLUE SHIELD | Admitting: Internal Medicine

## 2016-11-13 ENCOUNTER — Other Ambulatory Visit: Payer: Self-pay | Admitting: *Deleted

## 2016-11-13 MED ORDER — FLUCONAZOLE 100 MG PO TABS: 100.0000 mg | ORAL_TABLET | Freq: Every day | ORAL | 0 refills | Status: DC

## 2016-11-13 MED ORDER — AZITHROMYCIN 600 MG PO TABS: 1200.0000 mg | ORAL_TABLET | ORAL | 0 refills | Status: DC

## 2016-11-13 MED ORDER — SULFAMETHOXAZOLE-TRIMETHOPRIM 400-80 MG PO TABS: 1.0000 | ORAL_TABLET | Freq: Every day | ORAL | 0 refills | Status: DC

## 2016-11-13 MED FILL — AZITHROMYCIN 600 MG TABLET: 600 | 28 days supply | Qty: 8 | Fill #0

## 2016-11-13 MED FILL — FLUCONAZOLE 100 MG TABLET: 100 | 21 days supply | Qty: 21 | Fill #0

## 2016-11-13 MED FILL — SULFAMETHOXAZOLE/TMP SS TAB: 400-80 | 30 days supply | Qty: 30 | Fill #0

## 2016-11-27 ENCOUNTER — Other Ambulatory Visit: Payer: Self-pay | Admitting: Infectious Diseases

## 2016-11-27 ENCOUNTER — Telehealth: Payer: Self-pay | Admitting: *Deleted

## 2016-11-27 DIAGNOSIS — Z792 Long term (current) use of antibiotics: Secondary | ICD-10-CM

## 2016-11-27 DIAGNOSIS — B20 Human immunodeficiency virus [HIV] disease: Secondary | ICD-10-CM

## 2016-11-27 MED ORDER — SULFAMETHOXAZOLE-TRIMETHOPRIM 400-80 MG PO TABS: 1.0000 | ORAL_TABLET | Freq: Every day | ORAL | 0 refills | Status: DC

## 2016-11-27 MED ORDER — AZITHROMYCIN 600 MG PO TABS: 1200.0000 mg | ORAL_TABLET | ORAL | 3 refills | Status: DC

## 2016-11-27 MED ORDER — BICTEGRAVIR-EMTRICITAB-TENOFOV 50-200-25 MG PO TABS: 1.0000 | ORAL_TABLET | Freq: Every day | ORAL | 0 refills | Status: DC

## 2016-11-27 NOTE — Telephone Encounter (Signed)
Patient's mother called, stating he was ready for a refill.   RN unsure whether to send to [facility] (ADAP may not be approved yet) or for Patient Assistance (per Morrie SheldonAshley, patient needs to sign application). Morrie Sheldonshley will call patient's mother back.

## 2016-11-27 NOTE — Telephone Encounter (Signed)
[facility] [address]. I would think his ADAP would be approved... Well hope it would be.   Thank you

## 2016-11-27 NOTE — Telephone Encounter (Signed)
Carl MessierKathy is checking on ADAP status.

## 2016-11-28 ENCOUNTER — Other Ambulatory Visit: Payer: Self-pay | Admitting: *Deleted

## 2016-11-28 DIAGNOSIS — Z792 Long term (current) use of antibiotics: Secondary | ICD-10-CM

## 2016-11-28 DIAGNOSIS — B20 Human immunodeficiency virus [HIV] disease: Secondary | ICD-10-CM

## 2016-11-28 MED ORDER — BICTEGRAVIR-EMTRICITAB-TENOFOV 50-200-25 MG PO TABS: 1.0000 | ORAL_TABLET | Freq: Every day | ORAL | 6 refills | Status: DC

## 2016-11-28 MED ORDER — SULFAMETHOXAZOLE-TRIMETHOPRIM 400-80 MG PO TABS: 1.0000 | ORAL_TABLET | Freq: Every day | ORAL | 6 refills | Status: DC

## 2016-11-28 MED ORDER — AZITHROMYCIN 600 MG PO TABS: 1200.0000 mg | ORAL_TABLET | ORAL | 6 refills | Status: DC

## 2016-11-28 NOTE — Telephone Encounter (Signed)
Thank you :)

## 2016-12-06 ENCOUNTER — Ambulatory Visit (INDEPENDENT_AMBULATORY_CARE_PROVIDER_SITE_OTHER): Payer: Self-pay | Admitting: Pharmacist

## 2016-12-06 DIAGNOSIS — B20 Human immunodeficiency virus [HIV] disease: Secondary | ICD-10-CM

## 2016-12-06 DIAGNOSIS — Z23 Encounter for immunization: Secondary | ICD-10-CM

## 2016-12-06 MED ORDER — DIPHENOXYLATE-ATROPINE 2.5-0.025 MG PO TABS: 1.0000 | ORAL_TABLET | Freq: Four times a day (QID) | ORAL | 0 refills | Status: DC | PRN

## 2016-12-06 NOTE — Progress Notes (Signed)
HPI: Carl Bradley is a 26 y.o. male who is here to follow-up on his HIV disease after starting Biktarvy about one month ago.   Allergies: No Known Allergies  Vitals:    Past Medical History: Past Medical History:  Diagnosis Date  . HIV disease (HCC)   . Tobacco use 10/24/2016    Social History: Social History   Socioeconomic History  . Marital status: Single    Spouse name: Not on file  . Number of children: Not on file  . Years of education: Not on file  . Highest education level: Not on file  Social Needs  . Financial resource strain: Not on file  . Food insecurity - worry: Not on file  . Food insecurity - inability: Not on file  . Transportation needs - medical: Not on file  . Transportation needs - non-medical: Not on file  Occupational History  . Not on file  Tobacco Use  . Smoking status: Light Tobacco Smoker    Packs/day: 0.50    Types: Cigarettes  . Smokeless tobacco: Never Used  Substance and Sexual Activity  . Alcohol use: Yes    Comment: occasional  . Drug use: Yes    Types: Marijuana  . Sexual activity: Not Currently    Birth control/protection: None  Other Topics Concern  . Not on file  Social History Narrative  . Not on file     Current Regimen: Biktarvy  Labs: HIV 1 RNA Quant  Date Value  10/24/2016 335,000 Copies/mL (H)  11/02/2009 197000 copies/mL (H)   CD4 T Cell Abs  Date Value  10/24/2016 30 /uL (L)  05/25/2016 50 /uL (L)  11/02/2009 160 cmm (L)   Hep B S Ab (no units)  Date Value  10/24/2016 REACTIVE (A)   Hepatitis B Surface Ag (no units)  Date Value  10/24/2016 NON-REACTIVE   HCV Ab (no units)  Date Value  11/02/2009 NEG    CrCl: CrCl cannot be calculated (Patient's most recent lab result is older than the maximum 21 days allowed.).  Lipids:    Component Value Date/Time   CHOL 100 11/02/2009 2159   TRIG 105 11/02/2009 2159   HDL 30 (L) 11/02/2009 2159   CHOLHDL 3.3 Ratio 11/02/2009 2159   VLDL 21 11/02/2009  2159   LDLCALC 49 11/02/2009 2159    Assessment: Carl Bradley is a 26 yo male who presents to the pharmacy clinic with his mother today to follow-up on his HIV disease. He started [employer] about one month ago. Thankfully, his mother is very on top of him and makes sure that he takes his medications every day. His mother mentioned that he sometimes takes his medications late despite her trying to make sure he takes them every day at 10 AM. She also mentioned that he has some diarrhea that started shortly after he started his medication. Carl Bradley does not seem to notice the diarrhea but the mother does and notes that it has continued. We counseled them that these side effects should go away after he is on the medication for awhile. We will send in a prescription for Lomotil to try and aleve this problem. He is taking his Bactrim every day and Azithromycin on Tuesdays.   We counseled Carl Bradley that he needs to take some responsibility for his own care instead of relying on his mother so much. She is obviously stressed. I made Carl Bradley set an alarm in his phone for 10 AM every day to take his medication. I also provided  him with both a pillbox and a keychain in hopes that he will try and take care of himself.   Emit's mother was concerned that he had started to eat more frequently, about every 20 minutes, and asked if this could be due to the medication. We explained that this was likely due to his virus being suppressed and his body recovering from the HIV.   We started the Hepatitis A vaccine series today and will re-collect his viral load and CD4 count. He refused the flu shot again.   Recommendations: - HIV viral load and CD4 count today - Continue taking Biktarvy and Bactrim every day - Take azithromycin once a week on Tuesdays - Hep A # 1 today  - F/U with Rexene AlbertsStephanie Dixon  On 2/06 at 1:45 PM  Della GooEmily S Sinclair, PharmD, BCPS PGY2 Infectious Diseases Pharmacy Resident  [facility] 12/06/2016, 9:38 AM

## 2016-12-07 LAB — T-HELPER CELL (CD4) - (RCID CLINIC ONLY)
CD4 T CELL HELPER: 7 % — AB (ref 33–55)
CD4 T Cell Abs: 70 /uL — ABNORMAL LOW (ref 400–2700)

## 2016-12-08 LAB — HIV-1 RNA QUANT-NO REFLEX-BLD
HIV 1 RNA QUANT: 236 {copies}/mL — AB
HIV-1 RNA QUANT, LOG: 2.37 {Log_copies}/mL — AB

## 2017-01-30 ENCOUNTER — Ambulatory Visit: Payer: Self-pay

## 2017-02-01 ENCOUNTER — Encounter: Payer: Self-pay | Admitting: Infectious Diseases

## 2017-02-12 ENCOUNTER — Telehealth: Payer: Self-pay | Admitting: *Deleted

## 2017-02-12 NOTE — Telephone Encounter (Signed)
Barbara CowerJason, [facility], called to confirm patient's upcoming appointment Wednesday.  Patient has been named as a syphilis contact, probably source. Barbara CowerJason has spoken with patient, but he has refused to meet in the field, stating "I have an appointment Wednesday to take care of that." Barbara CowerJason is asking for [facility] to test/treat for syphilis. RN advised Barbara CowerJason that patient was last tested in October 2018, was not yet due for routine testing; it is up to the provider for this test/treatment.  Barbara CowerJason offered to come and meet patient after the provider visit, do a field test in office for syphilis. Andree CossHowell, Lorina Duffner M, RN

## 2017-02-14 ENCOUNTER — Encounter: Payer: Self-pay | Admitting: Infectious Diseases

## 2017-02-14 ENCOUNTER — Ambulatory Visit (INDEPENDENT_AMBULATORY_CARE_PROVIDER_SITE_OTHER): Payer: Self-pay | Admitting: Infectious Diseases

## 2017-02-14 VITALS — BP 129/78 | HR 98 | Temp 98.7°F | Ht 72.0 in | Wt 175.0 lb

## 2017-02-14 DIAGNOSIS — G3184 Mild cognitive impairment, so stated: Secondary | ICD-10-CM

## 2017-02-14 DIAGNOSIS — Z720 Tobacco use: Secondary | ICD-10-CM

## 2017-02-14 DIAGNOSIS — F4321 Adjustment disorder with depressed mood: Secondary | ICD-10-CM

## 2017-02-14 DIAGNOSIS — B20 Human immunodeficiency virus [HIV] disease: Secondary | ICD-10-CM

## 2017-02-14 DIAGNOSIS — R0602 Shortness of breath: Secondary | ICD-10-CM

## 2017-02-14 MED ORDER — DIPHENOXYLATE-ATROPINE 2.5-0.025 MG PO TABS: 1.0000 | ORAL_TABLET | Freq: Four times a day (QID) | ORAL | 2 refills | Status: DC | PRN

## 2017-02-14 NOTE — Telephone Encounter (Signed)
Thank you Marcelino DusterMichelle. I added an RPR on today. He was negative and reports no sexual contacts recently (although I am suspicious of the accuracy of that).

## 2017-02-14 NOTE — Patient Instructions (Addendum)
Continue your biktarvy every day   We will check your viral load today.   Continue the bactrim every day and the azithromycin once a week for now until your labs come back.   Will call you with your syphilis tests today.   Please make an appointment to meet with Marylu LundJanet our counselor   Please come back to see Judeth CornfieldStephanie again in 2 months with labs 2 weeks before.

## 2017-02-14 NOTE — Assessment & Plan Note (Signed)
Reviewed last CXR from May 2018 when he had pneumonia. Heart size was normal and no evidence of CHF then. He has no constitutional symptoms that would suggest a concerning infectious process. I suspect that with his increased smoking, weight gain and lack of activity this is more likely what is contributing to this process. I have asked him to please cut down on smoking and start to incorporate some mild exercise   With CD4 count I have a low threshold to check CXR should anything else develop. He has been on OI prophylaxis for PJP.

## 2017-02-14 NOTE — Progress Notes (Signed)
Name: Carl Bradley  DOB: 1990/08/22 MRN: [id number]  PCP: Patient, No Pcp Per   Chief Complaint  Patient presents with  . HIV Positive/AIDS    routine follow up  . Memory Loss  . Nicotine Dependence    Patient Active Problem List   Diagnosis Date Noted  . 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 Shortness of breath 02/14/2017  . Tobacco use 10/24/2016  . Healthcare maintenance 10/24/2016  . Adjustment reaction 11/23/2009  . AIDS (acquired immune deficiency syndrome) (HCC) 08/06/2009    Brief Narrative: 27 y.o. AA male with HIV infection diagnosed in 2011 after being tested to donate blood in high school; HIV RNA in 10/2009 with VL 197,000 and apparently his CD4 was < 200 at that time. Defered any treatment until late 2018. CD4 nadir 30. OI's: Oral Thrush. HIV Risk: MSM.   Genotype: sensitive 11/2016  Patient Active Problem List   Diagnosis Date Noted  . 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 Shortness of breath 02/14/2017  . Tobacco use 10/24/2016  . Healthcare maintenance 10/24/2016  . Adjustment reaction 11/23/2009  . AIDS (acquired immune deficiency syndrome) (HCC) 08/06/2009    SUBJECTIVE:   HPI/ROS:  Carl Bradley is here today with his mother for HIV follow up. There are several concerns his mother would like to address today.   Carl Bradley is doing well with his Biktarvy taking this every day and has not missed any doses from what he can recall however he has to rely on his mother to remind him. He has regained weight and appetite is improved however his mother is concerned over the poor diet he has citing specifically he eats and drinks way too much sugar. He has poor sleep habits and likes to watch TV in the bedroom and finds this distracting to him from falling asleep. He feels he gets about 8 hours of sleep but some nights much less. He is not currently sexually active.   He does have some increased fatigue  and dyspnea with exertion. He has a hard time walking around at [facility] and finds he wants to sit more often. He is more short of breath walking longer distances to his friends house as well and often needs to have his mother pick him up. Denies fevers, cough, chest pain, leg swelling or orthopnea. He does not exercise.   He has been contacted by [facility] Barbara Cower) regarding possible syphilis contact and requests lab work to check today. No active symptoms suggestive of this condition.   He is not currently working as he has continued to have memory problems - they are wondering if the HIV has caused this. She has seen no improvement of these symptoms. He smokes 1.5 ppd currently and is somewhat interested in quitting. Mostly he smokes because he is bored.   Review of Systems  Constitutional: Positive for malaise/fatigue. Negative for chills, fever and weight loss.  HENT: Negative for sore throat.   Eyes: Negative for blurred vision.  Respiratory: Positive for shortness of breath. Negative for cough and sputum production.   Cardiovascular: Negative for chest pain, orthopnea and leg swelling.  Gastrointestinal: Negative for abdominal pain, blood in stool, diarrhea and vomiting.  Genitourinary: Negative for dysuria.  Musculoskeletal: Negative for joint pain, myalgias and neck pain.  Skin: Negative for itching and rash.  Neurological: Positive for headaches.  Psychiatric/Behavioral: Positive for memory loss. Negative for depression and substance abuse. The patient is not nervous/anxious.  Past Medical History:  Diagnosis Date  . HIV disease (HCC)   . Tobacco use 10/24/2016   Outpatient Medications Prior to Visit  Medication Sig Dispense Refill  . bictegravir-emtricitabine-tenofovir AF (BIKTARVY) 50-200-25 MG TABS tablet Take 1 tablet daily by mouth. Try to take at the same time each day with or without food. 30 tablet 6  . sulfamethoxazole-trimethoprim (BACTRIM) 400-80 MG tablet Take 1 tablet  daily by mouth. 30 tablet 6  . azithromycin (ZITHROMAX) 600 MG tablet Take 2 tablets (1,200 mg total) once a week by mouth. (Patient not taking: Reported on 02/14/2017) 8 tablet 6  . clotrimazole-betamethasone (LOTRISONE) cream Apply to affected area 2 times daily prn (Patient not taking: Reported on 07/29/2015) 15 g 0  . fluconazole (DIFLUCAN) 100 MG tablet Take 1 tablet (100 mg total) daily by mouth. 21 tablet 0  . triamcinolone lotion (KENALOG) 0.1 % Apply 1 application topically 3 (three) times daily. (Patient not taking: Reported on 11/02/2016) 60 mL 0  . diphenoxylate-atropine (LOMOTIL) 2.5-0.025 MG tablet Take 1 tablet by mouth 4 (four) times daily as needed for diarrhea or loose stools. 30 tablet 0   No facility-administered medications prior to visit.    No Known Allergies  Social History   Tobacco Use  . Smoking status: Light Tobacco Smoker    Packs/day: 0.50    Types: Cigarettes  . Smokeless tobacco: Never Used  Substance Use Topics  . Alcohol use: Yes    Comment: occasional  . Drug use: Yes    Types: Marijuana    Family History  Problem Relation Age of Onset  . Healthy Mother   . Healthy Father    OBJECTIVE: Vitals:   02/14/17 1346  BP: 129/78  Pulse: 98  Temp: 98.7 F (37.1 C)    Physical Exam  Constitutional: He is oriented to person, place, and time. Vital signs are normal.  In good spirits today. Appears to have put on weight and looks well today. Accompanied by his mother.   HENT:  Mouth/Throat: Oropharynx is clear and moist.  Eyes: No scleral icterus.  Cardiovascular: Regular rhythm and normal heart sounds. Tachycardia present.  Pulmonary/Chest: Effort normal and breath sounds normal. No respiratory distress.  Abdominal: Soft. Bowel sounds are normal. He exhibits no distension. There is no tenderness.  Musculoskeletal: Normal range of motion. He exhibits no edema.  Lymphadenopathy:    He has no cervical adenopathy.  Neurological: He is alert and  oriented to person, place, and time.  Skin: Skin is warm and dry. No rash noted. He is not diaphoretic.  Psychiatric: Mood normal.  Easily distracted on his phone. Does not offer much to conversation unless asked directly.     Lab Results Lab Results  Component Value Date   WBC 6.5 10/24/2016   HGB 11.4 (L) 10/24/2016   HCT 33.7 (L) 10/24/2016   MCV 82.4 10/24/2016   PLT 293 10/24/2016    Lab Results  Component Value Date   CREATININE 0.92 10/24/2016   BUN 13 10/24/2016   NA 134 (L) 10/24/2016   K 3.8 10/24/2016   CL 98 10/24/2016   CO2 25 10/24/2016    Lab Results  Component Value Date   ALT 8 (L) 10/24/2016   AST 20 10/24/2016   ALKPHOS 91 05/25/2016   BILITOT 0.4 10/24/2016    HIV 1 RNA Quant  Date Value  02/14/2017 146 copies/mL (H)  12/06/2016 236 copies/mL (H)  10/24/2016 335,000 Copies/mL (H)   CD4 T Cell Abs (/uL)  Date Value  02/14/2017 130 (L)  12/06/2016 70 (L)  10/24/2016 30 (L)    Assessment and Plan:  Problem List Items Addressed This Visit      Nervous and Auditory   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 This is also not helping his shortness of breath or fatigue.       Shortness of breath    Reviewed last CXR from May 2018 when he had pneumonia. Heart size was normal and no evidence of CHF then. He has no constitutional symptoms that would suggest a concerning infectious process. I suspect that with his increased smoking, weight gain and lack of activity this is more likely what is contributing to this process. I have asked him to please cut down on smoking and start to incorporate some mild exercise   With CD4 count I have a low threshold to check CXR should anything else develop. He has been on OI prophylaxis for PJP.       AIDS (acquired immune deficiency syndrome) (HCC) - Primary    Seems he is doing well on Biktarvy but I suspect  he has strong influence of his mother's tight attention to make this happen. I will check VL and CD4 today. Continue Biktarvy and Bactrim and Azithromycin until we see how his labs are doing. He will return in 2 months with labs prior to the visit so we can discuss in person and continue with adherence counseling. Safe sex counseling provided today.       Relevant Orders   RPR (Completed)   T-helper cell (CD4)- ([facility] clinic only) (Completed)   HIV 1 RNA quant-no reflex-bld (Completed)   Ambulatory referral to Family Practice   HIV 1 RNA quant-no reflex-bld   T-helper cell (CD4)- ([facility] clinic only)   Adjustment reaction    He admits that he likely needs to talk with someone about the disease and how it has affected him mentally/physically. He does believe depression is playing a role. I will have him schedule an appointment with Marylu Lund first available appointment for Shon Hale and his mother.   Sleep is also poor. We discussed sleep hygiene today including avoiding caffeine/nicotine a few hours before bed, no TV in bedroom. I advised to try Melatonin OTC. If this is not helpful with behavior modification will trial him on Trazodone.          Rexene Alberts, MSN, NP-C [facility]  02/21/17 11:06 PM

## 2017-02-15 LAB — RPR: RPR Ser Ql: NONREACTIVE

## 2017-02-16 LAB — T-HELPER CELL (CD4) - (RCID CLINIC ONLY)
CD4 T CELL ABS: 130 /uL — AB (ref 400–2700)
CD4 T CELL HELPER: 12 % — AB (ref 33–55)

## 2017-02-16 LAB — HIV-1 RNA QUANT-NO REFLEX-BLD
HIV 1 RNA QUANT: 146 {copies}/mL — AB
HIV-1 RNA QUANT, LOG: 2.16 {Log_copies}/mL — AB

## 2017-02-21 DIAGNOSIS — R419 Unspecified symptoms and signs involving cognitive functions and awareness: Secondary | ICD-10-CM | POA: Insufficient documentation

## 2017-02-21 NOTE — Assessment & Plan Note (Signed)
Counseled on necessity to quit to avoid long term CV impact. This is also not helping his shortness of breath or fatigue.

## 2017-02-21 NOTE — Assessment & Plan Note (Addendum)
He admits that he likely needs to talk with someone about the disease and how it has affected him mentally/physically. He does believe depression is playing a role. I will have him schedule an appointment with Carl Bradley first available appointment for Carl Bradley and his mother.   Sleep is also poor. We discussed sleep hygiene today including avoiding caffeine/nicotine a few hours before bed, no TV in bedroom. I advised to try Melatonin OTC. If this is not helpful with behavior modification will trial him on Trazodone.

## 2017-02-21 NOTE — Assessment & Plan Note (Signed)
Seems he is doing well on Biktarvy but I suspect he has strong influence of his mother's tight attention to make this happen. I will check VL and CD4 today. Continue Biktarvy and Bactrim and Azithromycin until we see how his labs are doing. He will return in 2 months with labs prior to the visit so we can discuss in person and continue with adherence counseling. Safe sex counseling provided today.

## 2017-03-22 ENCOUNTER — Telehealth: Payer: Self-pay | Admitting: *Deleted

## 2017-03-22 NOTE — Telephone Encounter (Signed)
Yes please! Either is fine with me.

## 2017-03-22 NOTE — Telephone Encounter (Signed)
Patient's mother is calling back asking about the status of his referral for primary care, and then ultimately neurology as appropriate.  Patient does not have insurance, will need to work with [employer] program.  Will need referral follow up. Andree CossHowell, Jusitn Salsgiver M, RN

## 2017-04-12 ENCOUNTER — Telehealth: Payer: Self-pay

## 2017-04-12 NOTE — Telephone Encounter (Signed)
Attempted to call patient to let him know [facility] said his RPR was negative per the request of Stephanie Dixon,NP Towanda OctaveLanatra Felice Deem, LPN

## 2017-04-12 NOTE — Telephone Encounter (Signed)
-----   Message from Blanchard KelchStephanie N Dixon, NP sent at 02/15/2017 12:29 PM EST ----- Please call patient / [facility] his RPR is negative.

## 2017-04-17 ENCOUNTER — Ambulatory Visit (INDEPENDENT_AMBULATORY_CARE_PROVIDER_SITE_OTHER): Payer: Self-pay | Admitting: Licensed Clinical Social Worker

## 2017-04-17 ENCOUNTER — Encounter: Payer: Self-pay | Admitting: Infectious Diseases

## 2017-04-17 ENCOUNTER — Ambulatory Visit (INDEPENDENT_AMBULATORY_CARE_PROVIDER_SITE_OTHER): Payer: Self-pay | Admitting: Infectious Diseases

## 2017-04-17 VITALS — BP 111/71 | HR 70 | Temp 98.2°F | Ht 72.0 in | Wt 176.0 lb

## 2017-04-17 DIAGNOSIS — Z23 Encounter for immunization: Secondary | ICD-10-CM

## 2017-04-17 DIAGNOSIS — Z792 Long term (current) use of antibiotics: Secondary | ICD-10-CM

## 2017-04-17 DIAGNOSIS — R413 Other amnesia: Secondary | ICD-10-CM

## 2017-04-17 DIAGNOSIS — Z0000 Encounter for general adult medical examination without abnormal findings: Secondary | ICD-10-CM

## 2017-04-17 DIAGNOSIS — R419 Unspecified symptoms and signs involving cognitive functions and awareness: Secondary | ICD-10-CM

## 2017-04-17 DIAGNOSIS — F4324 Adjustment disorder with disturbance of conduct: Secondary | ICD-10-CM

## 2017-04-17 DIAGNOSIS — B20 Human immunodeficiency virus [HIV] disease: Secondary | ICD-10-CM

## 2017-04-17 MED ORDER — SULFAMETHOXAZOLE-TRIMETHOPRIM 400-80 MG PO TABS: 1.0000 | ORAL_TABLET | Freq: Every day | ORAL | 6 refills | Status: DC

## 2017-04-17 NOTE — Progress Notes (Signed)
Name: Carl Bradley  DOB: 06/02/1990 MRN: [id number]  PCP: Patient, No Pcp Per    Patient Active Problem List   Diagnosis Date Noted  . Neurocognitive disorder 02/21/2017  . Tobacco use 10/24/2016  . Healthcare maintenance 10/24/2016  . AIDS (acquired immune deficiency syndrome) ([place]) 08/06/2009    SUBJECTIVE:  Brief Narrative:  Carl Bradley is a 27 y.o. AA male with HIV infection diagnosed in 2011 after being tested to donate blood in high school; HIV RNA in 10/2009 with VL 197,000 and apparently his CD4 was < 200 at that time. Defered any treatment until late 2018. CD4 nadir 30. OI's: Oral Thrush. HIV Risk: MSM.   Previous Regimen:   Biktarvy    Genotype:   11/2016 - sensitive  HPI/ROS:  Carl Bradley is here today with his mother for HIV follow up. Although he has no concerns his mother has several to discuss.   In talking with Carl Bradley he is doing well taking his Biktarvy and has not missed any doses. No associated side effects that are concerning for him. Estimates no missed doses. Improved appetite, gained weight back and feeling overall better. Reports improvement in fatigue and no further dyspnea. In discussion with his mother she is very worried about his behaviors at home. Reports that in addition to the memory problems he will now occasionally urinate on himself and defecate outdoors/inappropriately; blamed it on his niece at one point. Needs reminders for self care including bathing. Has been observed stealing things at home and feels that she needs to keep things 'hidden from him.' She has some concerns about possible drug use. Carl Bradley denies fevers, cough, headaches, vision changes, neck pain. When asked he does report some confusion and that he shares "her concerns." He does not offer explanation as to why he has these behaviors and shrugs when asked. He states he is aware they are occurring and he recalls these events. He explicitly denies drug use. His mother is also very concerned  about cigarette smoking and the potential for diabetes considering his poor diet.   Review of Systems  Constitutional: Negative for chills, fever, malaise/fatigue and weight loss.  HENT: Negative for sore throat.        No dental problems  Eyes: Negative for blurred vision.  Respiratory: Negative for cough, sputum production and shortness of breath.   Cardiovascular: Negative for chest pain, orthopnea and leg swelling.  Gastrointestinal: Negative for abdominal pain, blood in stool, diarrhea and vomiting.  Genitourinary: Negative for dysuria and flank pain.  Musculoskeletal: Negative for joint pain, myalgias and neck pain.  Skin: Negative for itching and rash.  Neurological: Negative for dizziness, tingling, focal weakness, seizures, loss of consciousness, weakness and headaches.  Psychiatric/Behavioral: Positive for memory loss. Negative for depression, hallucinations and substance abuse. The patient is not nervous/anxious and does not have insomnia.    Past Medical History:  Diagnosis Date  . HIV disease ([place])   . Tobacco use 10/24/2016   Outpatient Medications Prior to Visit  Medication Sig Dispense Refill  . azithromycin (ZITHROMAX) 600 MG tablet Take 2 tablets (1,200 mg total) once a week by mouth. 8 tablet 6  . bictegravir-emtricitabine-tenofovir AF (BIKTARVY) 50-200-25 MG TABS tablet Take 1 tablet daily by mouth. Try to take at the same time each day with or without food. 30 tablet 6  . fluconazole (DIFLUCAN) 100 MG tablet Take 1 tablet (100 mg total) daily by mouth. 21 tablet 0  . sulfamethoxazole-trimethoprim (BACTRIM) 400-80 MG tablet Take 1 tablet  daily by mouth. 30 tablet 6  . clotrimazole-betamethasone (LOTRISONE) cream Apply to affected area 2 times daily prn (Patient not taking: Reported on 07/29/2015) 15 g 0  . diphenoxylate-atropine (LOMOTIL) 2.5-0.025 MG tablet Take 1 tablet by mouth 4 (four) times daily as needed for diarrhea or loose stools. 30 tablet 2  . triamcinolone  lotion (KENALOG) 0.1 % Apply 1 application topically 3 (three) times daily. (Patient not taking: Reported on 11/02/2016) 60 mL 0   No facility-administered medications prior to visit.    No Known Allergies  Social History   Tobacco Use  . Smoking status: Current Every Day Smoker    Packs/day: 0.75    Types: Cigarettes  . Smokeless tobacco: Never Used  Substance Use Topics  . Alcohol use: Yes    Comment: occasional  . Drug use: Yes    Types: Marijuana    Family History  Problem Relation Age of Onset  . Healthy Mother   . Healthy Father    OBJECTIVE: Vitals:   04/17/17 1441  BP: 111/71  Pulse: 70  Temp: 98.2 F (36.8 C)    Physical Exam  Constitutional: He is oriented to person, place, and time. Vital signs are normal.  Comfortable seated in chair.  Accompanied by his mother.   HENT:  Mouth/Throat: Oropharynx is clear and moist.  Eyes: No scleral icterus.  Cardiovascular: Regular rhythm and normal heart sounds. Tachycardia present.  Pulmonary/Chest: Effort normal and breath sounds normal. No respiratory distress.  Abdominal: Soft. Bowel sounds are normal. He exhibits no distension. There is no tenderness.  Musculoskeletal: Normal range of motion. He exhibits no edema.  Lymphadenopathy:    He has no cervical adenopathy.  Neurological: He is alert and oriented to person, place, and time. No cranial nerve deficit. Gait normal. Coordination normal. GCS score is 15.  Skin: Skin is warm and dry. No rash noted. He is not diaphoretic.  Psychiatric: Mood normal.  Easily distracted on his phone. Does not offer much to conversation unless asked directly. Flat affect and blunted reaction to everything.     Lab Results Lab Results  Component Value Date   WBC 6.5 10/24/2016   HGB 11.4 (L) 10/24/2016   HCT 33.7 (L) 10/24/2016   MCV 82.4 10/24/2016   PLT 293 10/24/2016    Lab Results  Component Value Date   CREATININE 0.92 10/24/2016   BUN 13 10/24/2016   NA 134 (L)  10/24/2016   K 3.8 10/24/2016   CL 98 10/24/2016   CO2 25 10/24/2016    Lab Results  Component Value Date   ALT 8 (L) 10/24/2016   AST 20 10/24/2016   ALKPHOS 91 05/25/2016   BILITOT 0.4 10/24/2016    HIV 1 RNA Quant  Date Value  02/14/2017 146 copies/mL (H)  12/06/2016 236 copies/mL (H)  10/24/2016 335,000 Copies/mL (H)   CD4 T Cell Abs (/uL)  Date Value  02/14/2017 130 (L)  12/06/2016 70 (L)  10/24/2016 30 (L)    Assessment and Plan:  Problem List Items Addressed This Visit      Nervous and Auditory   Neurocognitive disorder    I am fairly certain that nearly a decade of untreated HIV infection is playing a role regarding dementia symptoms; however I worry about drug use, underlying mental health disorder or opportunistic infection secondary to AIDS, although for the latter no functional neurologic deficits, or headaches exist. No observed improvement since starting ART and actually seems that he has had worse, more bizarre  behaviors occur (ie: inappropriate/public defecation and urination). CN II-XII intact. Normal gait and muscular tone of bilateral U/L extremities. No s/s of meningoencephalitis.  He does not have a hx of syphilis and repeatedly seronegative over the last 5 months. Serum cryptococal Ag negative. Will check toxo IgG today. Will arrange for MRI of the head to evaluate for lesions. May also need LP to rule out infectious etiology.   He has an extremely flat affect and offers no explanation or concern for himself while his mother (whom is clearly upset) remarked on his behaviors recently. Most inappropriate response.I will have him and his mother meet with our mental health counselor Rollene Fare today to help with consideration of mental health diagnosis. May need psychiatric evaluation for competency and guardianship consideration.   Will check urine drug screen today. (ADDENDUM: unable to provide urine sample and "will return to provide sample.")  I will place  referral for neurologic evaluation as well for help regarding assessment, work up and diagnosis for alternative explanations of his symptoms.       Relevant Orders   Ambulatory referral to Neurology   MR BRAIN W CONTRAST     Other   AIDS (acquired immune deficiency syndrome) ([place]) - Primary    Tolerating Biktarvy well. Check VL and CD4 today. Stop azthromycin therapy for MAC prophylaxis. Continue bactrim daily. Will screen for diabetes with A1c per mother's request and family history.       Relevant Medications   sulfamethoxazole-trimethoprim (BACTRIM) 400-80 MG tablet   Other Relevant Orders   Hemoglobin A1c   Toxoplasma gondii antibody, IgG   HIV 1 RNA quant-no reflex-bld   T-helper cell (CD4)- ([facility] clinic only)   Pneumococcal polysaccharide vaccine 23-valent greater than or equal to 2yo subcutaneous/IM (Completed)   Hepatitis A vaccine adult IM (Completed)   MR BRAIN W CONTRAST   Healthcare maintenance    Hep A #1 vaccine and pneumovax today. Give Hep A #2 in October.        Other Visit Diagnoses    Functional memory problem       Relevant Orders   Ambulatory referral to Neurology   MR BRAIN W CONTRAST   Prophylactic antibiotic       Relevant Medications   sulfamethoxazole-trimethoprim (BACTRIM) 400-80 MG tablet   Need for pneumococcal vaccine       Relevant Orders   Pneumococcal polysaccharide vaccine 23-valent greater than or equal to 2yo subcutaneous/IM (Completed)   Need for hepatitis A immunization       Relevant Orders   Hepatitis A vaccine adult IM (Completed)      Janene Madeira, MSN, NP-C [facility]  04/17/17 10:15 PM

## 2017-04-17 NOTE — Assessment & Plan Note (Addendum)
Hep A #1 vaccine and pneumovax today. Give Hep A #2 in October.

## 2017-04-17 NOTE — Assessment & Plan Note (Addendum)
I am fairly certain that nearly a decade of untreated HIV infection is playing a role regarding dementia symptoms; however I worry about drug use, underlying mental health disorder or opportunistic infection secondary to AIDS, although for the latter no functional neurologic deficits, or headaches exist. No observed improvement since starting ART and actually seems that he has had worse, more bizarre behaviors occur (ie: inappropriate/public defecation and urination). CN II-XII intact. Normal gait and muscular tone of bilateral U/L extremities. No s/s of meningoencephalitis.  He does not have a hx of syphilis and repeatedly seronegative over the last 5 months. Serum cryptococal Ag negative. Will check toxo IgG today. Will arrange for MRI of the head to evaluate for lesions. May also need LP to rule out infectious etiology.   He has an extremely flat affect and offers no explanation or concern for himself while his mother (whom is clearly upset) remarked on his behaviors recently. Most inappropriate response.I will have him and his mother meet with our mental health counselor [name] today to help with consideration of mental health diagnosis. May need psychiatric evaluation for competency and guardianship consideration.   Will check urine drug screen today. (ADDENDUM: unable to provide urine sample and "will return to provide sample.")  I will place referral for neurologic evaluation as well for help regarding assessment, work up and diagnosis for alternative explanations of his symptoms.

## 2017-04-17 NOTE — BH Specialist Note (Signed)
Integrated Behavioral Health Initial Visit  MRN: [id number] Name: Carl KinLeon Huegel  Number of Integrated Behavioral Health Clinician visits:: 1/6 Session Start time: 4:00pm Session End time: 4:17PM Total time: 15 minutes  Type of Service: Integrated Behavioral Health- Individual/Family Interpretor:No. Interpretor Name and Language:n/a    Warm Hand Off Completed.       SUBJECTIVE: Carl Bradley is a 27 y.o. male accompanied by Mother Patient was referred by Rexene AlbertsStephanie Dixon for behavioral concerns Patient reports the following symptoms/concerns: using the bathroom on himself or in the yard, inapproriate decisions, trouble concentrating or remembering, not caring for hygeine, inability to self-monitor needs Duration of problem: 6 months; Severity of problem: severe  OBJECTIVE: Mood: Depressed and Affect: very flat Risk of harm to self or others: No plan to harm self or others  LIFE CONTEXT: Family and Social: lives with mom, who has concerns about him behaviorally and cognitively, friends all "walked away" 7-8 months ago and now has new friends but mom states they are complaining and getting annoyed with his behaviors; mom states she doesn't allow him to spend time with friends because they do no know about or understand his current state and would not have his best interests at heart Life Changes: had been unmedicated for HIV for over a decade before beginning meds 6 months ago; found [facility] and started meds because his health had gone so far downhill that he was visibly different and parents were concerned he was dying  GOALS ADDRESSED: Patient will: 1. Increase knowledge and/or ability of: self-management skills   INTERVENTIONS: Interventions utilized: Functional Assessment of ADLs and Psychoeducation and/or Health Education   ASSESSMENT: Patient currently experiencing severe change in functioning - client cannot remember to take showers, denies feeling any cues for excrement before he  has voided on himself, repeats himself and cannot remember things he is supposed to be doing, makes irresponsible choices with no insight (inviting people over who take things or trash the house and not realizing what is going on, making phone calls repeatedly and not recognizing what he is doing or that before 7AM is too early to call, repeats each line when watching a movie and does not understand others getting upset at him; little to no insight shown in session    PLAN: 1. Follow up with behavioral health clinician on an as-needed basis 2. Referral(s): refer back to physician - symptoms likely physical or neurological rather than mental health related. 3.  Angus Palmsegina Carle Fenech, LCSW

## 2017-04-17 NOTE — Assessment & Plan Note (Addendum)
Tolerating Biktarvy well. Check VL and CD4 today. Stop azthromycin therapy for MAC prophylaxis. Continue bactrim daily. Will screen for diabetes with A1c per mother's request and family history.

## 2017-04-17 NOTE — Patient Instructions (Addendum)
Please stop your azithromycin.   Continue your Biktarvy (HIV) and your Bactrim (antibiotic).   Will get some blood work today including drug screen  I will have our referral coordinator follow up on the neurologic evaluation - Please call us back if you have not heard back in a week.   Please come back to see Judeth CornfieldStephanie in 4 weeks.

## 2017-04-18 ENCOUNTER — Other Ambulatory Visit: Payer: Self-pay | Admitting: Behavioral Health

## 2017-04-18 ENCOUNTER — Other Ambulatory Visit: Payer: Self-pay | Admitting: Infectious Diseases

## 2017-04-18 ENCOUNTER — Telehealth: Payer: Self-pay | Admitting: Behavioral Health

## 2017-04-18 DIAGNOSIS — R4181 Age-related cognitive decline: Secondary | ICD-10-CM

## 2017-04-18 DIAGNOSIS — R413 Other amnesia: Secondary | ICD-10-CM

## 2017-04-18 DIAGNOSIS — R419 Unspecified symptoms and signs involving cognitive functions and awareness: Secondary | ICD-10-CM

## 2017-04-18 DIAGNOSIS — B20 Human immunodeficiency virus [HIV] disease: Secondary | ICD-10-CM

## 2017-04-18 NOTE — Telephone Encounter (Addendum)
Called [facility]  Appointment is scheduled for Tuesday April 24, 2017.  Patient is to arrive at 3:30pm for a 4:00pm appointment.  Will call patient's mother to inform her.  Called Ms. Herminio HeadsLinda Moore patient's mother per Rexene AlbertsStephanie Dixon NP to inform her patient will need MRI of head to further investigate significant changes in behavior.  Let her know patient's appointment for MRI scheduled for Tuesday April 24, 2017.  Let her know they are to arrive at 3:30 pm [facility].  Ms. Christell ConstantMoore Verbalized understanding.  Let her know how to apply for cone assistance and possible [facility] intake. Angeline SlimAshley Hill RN

## 2017-04-18 NOTE — Telephone Encounter (Signed)
-----   Message from Blanchard KelchStephanie N Dixon, NP sent at 04/17/2017 10:21 PM EDT ----- Morrie SheldonAshley can you please call Rourke's mom and let her know that I would like to go ahead and arrange a MRI of Opal's head considering the significant changes that have occurred to make sure there is nothing from an infection standpoint that would need treatment. I am happy to call them with any questions they may have.  Will need to work with Morrie SheldonAshley R about financial assistance. He likely qualifies for Medicaid and would ask [facility] to reach out to him regarding assistance and establishing him with their program if you could talk with Ladona Ridgelaylor about reaching out (tell mom she should expect a call since Ladona Ridgelaylor cannot leave a message without permission)   Thank you!

## 2017-04-18 NOTE — Telephone Encounter (Signed)
Thank you Morrie SheldonAshley. I appreciate your help!

## 2017-04-19 LAB — T-HELPER CELL (CD4) - (RCID CLINIC ONLY)
CD4 T CELL ABS: 130 /uL — AB (ref 400–2700)
CD4 T CELL HELPER: 14 % — AB (ref 33–55)

## 2017-04-19 LAB — HIV-1 RNA QUANT-NO REFLEX-BLD
HIV 1 RNA Quant: 32 copies/mL — ABNORMAL HIGH
HIV-1 RNA Quant, Log: 1.51 Log copies/mL — ABNORMAL HIGH

## 2017-04-20 LAB — HEMOGLOBIN A1C
EAG (MMOL/L): 4.7 (calc)
HEMOGLOBIN A1C: 4.6 %{Hb} (ref <5.7)
MEAN PLASMA GLUCOSE: 85 (calc)

## 2017-04-20 LAB — TOXOPLASMA GONDII ANTIBODY, IGG

## 2017-04-24 ENCOUNTER — Ambulatory Visit (HOSPITAL_COMMUNITY)
Admission: RE | Admit: 2017-04-24 | Discharge: 2017-04-24 | Disposition: A | Discharge status: Home / Self Care | Payer: Self-pay | Source: Ambulatory Visit | Attending: Infectious Diseases | Admitting: Infectious Diseases

## 2017-04-24 DIAGNOSIS — R419 Unspecified symptoms and signs involving cognitive functions and awareness: Secondary | ICD-10-CM | POA: Insufficient documentation

## 2017-04-24 DIAGNOSIS — B20 Human immunodeficiency virus [HIV] disease: Secondary | ICD-10-CM | POA: Insufficient documentation

## 2017-04-24 DIAGNOSIS — R413 Other amnesia: Secondary | ICD-10-CM | POA: Insufficient documentation

## 2017-04-24 DIAGNOSIS — R4181 Age-related cognitive decline: Secondary | ICD-10-CM | POA: Insufficient documentation

## 2017-04-24 DIAGNOSIS — G319 Degenerative disease of nervous system, unspecified: Secondary | ICD-10-CM | POA: Insufficient documentation

## 2017-04-24 MED ORDER — GADOBENATE DIMEGLUMINE 529 MG/ML IV SOLN
15.0000 mL | Freq: Once | INTRAVENOUS | Status: AC | PRN
  Administered 2017-04-24: 15 mL via INTRAVENOUS

## 2017-04-25 ENCOUNTER — Telehealth: Payer: Self-pay | Admitting: Infectious Diseases

## 2017-04-25 NOTE — Telephone Encounter (Signed)
Called patient's mother Carl HeadsLinda Bradley left a voicemail however Voicemail states she does not listen to her voicemail, informed her to call back.  Per Carl AlbertsStephanie Bradley patient needs to come in tomorrow 04/26/2017 for an office visit around 9:00 am.  Will try again later. Carl SlimAshley Hill RN

## 2017-04-25 NOTE — Telephone Encounter (Signed)
Called Kreston's mom about

## 2017-04-25 NOTE — Telephone Encounter (Signed)
Called patient's mother Herminio HeadsLinda Moore.  Informed her that patient has an appointment with Rexene AlbertsStephanie Dixon NP tomorrow morning 04/26/2017 at 9:00am.  Herminio HeadsLinda Moore verbalized understanding. Angeline SlimAshley Anisah Kuck RN

## 2017-04-26 ENCOUNTER — Encounter: Payer: Self-pay | Admitting: Infectious Diseases

## 2017-04-26 ENCOUNTER — Telehealth: Payer: Self-pay | Admitting: Behavioral Health

## 2017-04-26 ENCOUNTER — Telehealth: Payer: Self-pay | Admitting: Internal Medicine

## 2017-04-26 ENCOUNTER — Other Ambulatory Visit: Payer: Self-pay | Admitting: Infectious Diseases

## 2017-04-26 ENCOUNTER — Telehealth: Payer: Self-pay

## 2017-04-26 ENCOUNTER — Ambulatory Visit (INDEPENDENT_AMBULATORY_CARE_PROVIDER_SITE_OTHER): Payer: Self-pay | Admitting: Infectious Diseases

## 2017-04-26 VITALS — BP 106/69 | HR 78 | Temp 98.0°F | Wt 177.1 lb

## 2017-04-26 DIAGNOSIS — B20 Human immunodeficiency virus [HIV] disease: Secondary | ICD-10-CM

## 2017-04-26 DIAGNOSIS — R419 Unspecified symptoms and signs involving cognitive functions and awareness: Secondary | ICD-10-CM

## 2017-04-26 NOTE — Telephone Encounter (Signed)
Spoke with Judeth CornfieldStephanie NP stated patient will be admitted to [facility] for a lumbar puncture to r/o infection in spinal fluid.  She stated no lesions but wants  further neurological and psychiatric testing.  Called Ms. Herminio HeadsLinda Moore informed her that she was correct and patient does not have lesions on the brain  based off MRI results.  [facility] is to [facility] today when admitting calls her when a bed is available.  Explained to her that patient will have Lumbar puncture and further neurological and psychiatric testing   Ms. Herminio HeadsLinda Moore verbalized understanding and states she will make sure he gets to the hospital when called. Angeline SlimAshley Hill RN

## 2017-04-26 NOTE — Progress Notes (Signed)
Name: Carl Bradley  DOB: 04-05-1990 MRN: [id number]  PCP: Patient, No Pcp Per   Patient Active Problem List   Diagnosis Date Noted  . Neurocognitive disorder 02/21/2017  . Tobacco use 10/24/2016  . Healthcare maintenance 10/24/2016  . AIDS (acquired immune deficiency syndrome) (HCC) 08/06/2009    SUBJECTIVE:  Brief Narrative:  Carl Bradley is a 27 y.o. AA male with HIV infection diagnosed in 2011 after being tested to donate blood in high school; HIV RNA in 10/2009 with VL 197,000 and apparently his CD4 was < 200 at that time. Defered any treatment until late 2018. CD4 nadir 30. OI's: Oral Thrush. HIV Risk: MSM.   Previous Regimen:   Biktarvy   Genotype:   11/2016 - sensitive  HPI/ROS:  [name] is here today for HIV/AIDS follow up.   Carl Bradley is in good health today and feels well. He is here by himself as his mother is at work and was not able to get the day off. His grandfather drove him and is out in the car. He does not remember seeing or talking with me at our last encounter. He does recall having the scan of his head recently but not certain what it was for. He still needs reminders for self care including bathing and changing his clothes. He does not offer explanation as to why he has these behaviors and shrugs when asked. He states he is aware they are occurring and he recalls these events. He explicitly denies drug use.   Review of Systems  Constitutional: Negative for chills, fever, malaise/fatigue and weight loss.  HENT: Negative for sore throat.        No dental problems  Eyes: Negative for blurred vision.  Respiratory: Negative for cough, sputum production and shortness of breath.   Cardiovascular: Negative for chest pain, orthopnea and leg swelling.  Gastrointestinal: Negative for abdominal pain, blood in stool, diarrhea and vomiting.  Genitourinary: Negative for dysuria and flank pain.  Musculoskeletal: Negative for joint pain, myalgias and neck pain.  Skin: Negative  for itching and rash.  Neurological: Negative for dizziness, tingling, focal weakness, seizures, loss of consciousness, weakness and headaches.  Psychiatric/Behavioral: Positive for memory loss. Negative for depression, hallucinations and substance abuse. The patient is not nervous/anxious and does not have insomnia.    Past Medical History:  Diagnosis Date  . HIV disease (HCC)   . Tobacco use 10/24/2016   Outpatient Medications Prior to Visit  Medication Sig Dispense Refill  . bictegravir-emtricitabine-tenofovir AF (BIKTARVY) 50-200-25 MG TABS tablet Take 1 tablet daily by mouth. Try to take at the same time each day with or without food. 30 tablet 6  . diphenoxylate-atropine (LOMOTIL) 2.5-0.025 MG tablet Take 1 tablet by mouth 4 (four) times daily as needed for diarrhea or loose stools. 30 tablet 2  . sulfamethoxazole-trimethoprim (BACTRIM) 400-80 MG tablet Take 1 tablet by mouth daily. 30 tablet 6  . azithromycin (ZITHROMAX) 600 MG tablet Take 2 tablets (1,200 mg total) once a week by mouth. (Patient not taking: Reported on 04/26/2017) 8 tablet 6  . clotrimazole-betamethasone (LOTRISONE) cream Apply to affected area 2 times daily prn (Patient not taking: Reported on 07/29/2015) 15 g 0  . triamcinolone lotion (KENALOG) 0.1 % Apply 1 application topically 3 (three) times daily. (Patient not taking: Reported on 11/02/2016) 60 mL 0   No facility-administered medications prior to visit.    No Known Allergies  Social History   Tobacco Use  . Smoking status: Current Every Day Smoker  Packs/day: 0.75    Types: Cigarettes  . Smokeless tobacco: Never Used  Substance Use Topics  . Alcohol use: Yes    Comment: occasional  . Drug use: Yes    Types: Marijuana    Family History  Problem Relation Age of Onset  . Healthy Mother   . Healthy Father    OBJECTIVE: Vitals:   04/26/17 0908  BP: 106/69  Pulse: 78  Temp: 98 F (36.7 C)     Physical Exam  Constitutional: He is oriented to  person, place, and time. Vital signs are normal.  Comfortable seated in chair.    HENT:  Mouth/Throat: Oropharynx is clear and moist.  Eyes: Pupils are equal, round, and reactive to light. No scleral icterus.  Cardiovascular: Normal rate, regular rhythm and normal heart sounds.  Pulmonary/Chest: Effort normal and breath sounds normal. No respiratory distress.  Abdominal: Soft. Bowel sounds are normal. He exhibits no distension. There is no tenderness.  Musculoskeletal: Normal range of motion. He exhibits no edema.  Lymphadenopathy:    He has no cervical adenopathy.  Neurological: He is alert and oriented to person, place, and time. No cranial nerve deficit. Gait normal. Coordination normal. GCS score is 15.  Skin: Skin is warm and dry. No rash noted. He is not diaphoretic.  Psychiatric: Mood normal. His mood appears not anxious. His affect is labile. He is not agitated. He expresses impulsivity. He exhibits abnormal recent memory.  Today he is more personable but does not recall our previous visit. Frequently interrupting asking for water or 'snacks.'     Lab Results Lab Results  Component Value Date   WBC 6.5 10/24/2016   HGB 11.4 (L) 10/24/2016   HCT 33.7 (L) 10/24/2016   MCV 82.4 10/24/2016   PLT 293 10/24/2016    Lab Results  Component Value Date   CREATININE 0.92 10/24/2016   BUN 13 10/24/2016   NA 134 (L) 10/24/2016   K 3.8 10/24/2016   CL 98 10/24/2016   CO2 25 10/24/2016    Lab Results  Component Value Date   ALT 8 (L) 10/24/2016   AST 20 10/24/2016   ALKPHOS 91 05/25/2016   BILITOT 0.4 10/24/2016    HIV 1 RNA Quant (copies/mL)  Date Value  04/17/2017 32 (H)  02/14/2017 146 (H)  12/06/2016 236 (H)   CD4 T Cell Abs (/uL)  Date Value  04/17/2017 130 (L)  02/14/2017 130 (L)  12/06/2016 70 (L)   Assessment and Plan:  Problem List Items Addressed This Visit      Nervous and Auditory   Neurocognitive disorder     Other   AIDS (acquired immune deficiency  syndrome) (HCC) - Primary    Viral load indicating good control on Biktarvy. With his mother's help he has had excellent adherence. CD4 still low at 130 but improved from 30 over 6 months on therapy.  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 There very well may be a psychiatric component to his symptoms (his mood is more labile today compared to before) however at [age] years old and worsened course I worry he needs more inclusive work up for infectious neurologic etiology including lumbar puncture to assess for opportunistic contributor.   I talked with [name] today about my concern regarding symptom progression; considering his young age  and progression of symptoms I recommended admission to hospital for neurologic/psychiatric testing including LP in an effort to preserve further neurocognitive function. He agrees to this. I also spoke with his mother yesterday about my concerns (as I question his ability to make good decisions), and all are in agreement with this plan. I have discussed with Dr. Loney Loh with IM and will admit to med surge bed with neurology and psych consultation.         Rexene Alberts, MSN, NP-C [facility]  04/26/17 9:59 AM

## 2017-04-26 NOTE — Telephone Encounter (Signed)
Per sign out, patient was to be direct admitted to Internal Medicine Teaching Service from ID clinic. Patient placement attempted contacting multiple times at multiple numbers since 7pm when bed was available without being able to reach patient or his mother (emergency contact, with whom per notes admission was discussed earlier in the day as well). Bed placement is allowed to hold bed for 2 hours, however they have extended this due to multiple attempts at reaching patient.  I personally called primary number [phone]) twice w/o answer or ability to leave VM. I also called his mother [phone]) three times w/o answer and voicemail stating it will not be checked (though bed management stated they did leave message).   Also contacted grandfather (listed in telephone note earlier today as good contact as well) at [phone] - initial phone call asked me to hold then hung up; second phone call the person answering stated they don't know a [name].  This was communicated to bed placement who will hold bed until 11pm in hopes patient or family call back.  Nyra MarketGorica Jacqulin Brandenburger, MD IMTS - PGY2

## 2017-04-26 NOTE — Telephone Encounter (Signed)
Thank you very much 

## 2017-04-26 NOTE — Telephone Encounter (Signed)
Called Bed control per Rexene AlbertsStephanie Dixon, Np to have pt admitted at [facility].   Time bed control was called: 10:00 am Type of bed: Med Surge Admitting MD: Loney Lohathore, MD Reason of admission: Neurological changes  Gave Bed control two good call back numbers: [phone] (mom) [phone] (grandfather)  Pt is aware to look out for a call from Bed Control and will be waiting at home for a call. Lorenso CourierJose L Maldonado, [place]

## 2017-04-26 NOTE — Assessment & Plan Note (Addendum)
Viral load indicating good control on Biktarvy. With his mother's help he has had excellent adherence. CD4 still low at 130 but improved from 30 over 6 months on therapy.  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 There very well may be a psychiatric component to his symptoms (his mood is more labile today compared to before) however at 27 years old and 3worsened course I worry he needs more inclusive work up for infectious neurologic etiology including lumbar puncture to assess for opportunistic contributor.   I talked with [name] today about my concern regarding symptom progression; considering his young age and progression of symptoms I recommended admission to hospital for neurologic/psychiatric testing including LP in an effort to preserve further neurocognitive function. He agrees to this. I also spoke with his mother yesterday about my concerns (as I question his ability to make good decisions), and all are in agreement with this plan. I have discussed with Dr. Loney Lohathore with IM and will admit to med surge bed with neurology and psych consultation.

## 2017-04-26 NOTE — Telephone Encounter (Signed)
Herminio HeadsLinda Bradley, Patient's mother called to inquire about patient's visit today.  She states patient came home stating he has a lesion on his brain and needed to go to the hospital.  Ms.  Christell ConstantMoore states she spoke with Judeth CornfieldStephanie via phone yesterday and based off MRI results there were no lesions present.  Writer informed Ms. Bradley that she would contact ThorntonStephanie for details and call her back. Angeline SlimAshley Hill RN

## 2017-04-27 ENCOUNTER — Inpatient Hospital Stay (HOSPITAL_COMMUNITY)
Admission: AD | Admit: 2017-04-27 | Discharge: 2017-04-29 | DRG: 974 | Disposition: A | Discharge status: Home / Self Care | Payer: Self-pay | Source: Ambulatory Visit | Attending: Internal Medicine | Admitting: Internal Medicine

## 2017-04-27 ENCOUNTER — Other Ambulatory Visit: Payer: Self-pay

## 2017-04-27 ENCOUNTER — Encounter (HOSPITAL_COMMUNITY): Payer: Self-pay | Admitting: General Practice

## 2017-04-27 DIAGNOSIS — R419 Unspecified symptoms and signs involving cognitive functions and awareness: Secondary | ICD-10-CM | POA: Diagnosis present

## 2017-04-27 DIAGNOSIS — B20 Human immunodeficiency virus [HIV] disease: Principal | ICD-10-CM | POA: Diagnosis present

## 2017-04-27 DIAGNOSIS — F918 Other conduct disorders: Secondary | ICD-10-CM | POA: Diagnosis present

## 2017-04-27 DIAGNOSIS — F419 Anxiety disorder, unspecified: Secondary | ICD-10-CM | POA: Diagnosis present

## 2017-04-27 DIAGNOSIS — F17210 Nicotine dependence, cigarettes, uncomplicated: Secondary | ICD-10-CM | POA: Diagnosis present

## 2017-04-27 DIAGNOSIS — R9082 White matter disease, unspecified: Secondary | ICD-10-CM | POA: Diagnosis present

## 2017-04-27 DIAGNOSIS — F070 Personality change due to known physiological condition: Secondary | ICD-10-CM | POA: Diagnosis present

## 2017-04-27 DIAGNOSIS — G9349 Other encephalopathy: Secondary | ICD-10-CM | POA: Diagnosis present

## 2017-04-27 DIAGNOSIS — E538 Deficiency of other specified B group vitamins: Secondary | ICD-10-CM | POA: Diagnosis present

## 2017-04-27 DIAGNOSIS — F4322 Adjustment disorder with anxiety: Secondary | ICD-10-CM | POA: Diagnosis present

## 2017-04-27 DIAGNOSIS — G319 Degenerative disease of nervous system, unspecified: Secondary | ICD-10-CM | POA: Diagnosis present

## 2017-04-27 DIAGNOSIS — G053 Encephalitis and encephalomyelitis in diseases classified elsewhere: Secondary | ICD-10-CM | POA: Diagnosis present

## 2017-04-27 DIAGNOSIS — G3189 Other specified degenerative diseases of nervous system: Secondary | ICD-10-CM

## 2017-04-27 DIAGNOSIS — G9340 Encephalopathy, unspecified: Secondary | ICD-10-CM | POA: Diagnosis present

## 2017-04-27 DIAGNOSIS — Z720 Tobacco use: Secondary | ICD-10-CM | POA: Diagnosis present

## 2017-04-27 LAB — COMPREHENSIVE METABOLIC PANEL
ALT: 16 U/L — ABNORMAL LOW (ref 17–63)
AST: 24 U/L (ref 15–41)
Albumin: 4 g/dL (ref 3.5–5.0)
Alkaline Phosphatase: 94 U/L (ref 38–126)
Anion gap: 9 (ref 5–15)
BILIRUBIN TOTAL: 0.7 mg/dL (ref 0.3–1.2)
BUN: 10 mg/dL (ref 6–20)
CO2: 24 mmol/L (ref 22–32)
Calcium: 9.2 mg/dL (ref 8.9–10.3)
Chloride: 104 mmol/L (ref 101–111)
Creatinine, Ser: 1.24 mg/dL (ref 0.61–1.24)
GFR calc Af Amer: >60 mL/min (ref >60)
GFR calc non Af Amer: >60 mL/min (ref >60)
Glucose, Bld: 91 mg/dL (ref 65–99)
POTASSIUM: 3.9 mmol/L (ref 3.5–5.1)
Sodium: 137 mmol/L (ref 135–145)
TOTAL PROTEIN: 7.2 g/dL (ref 6.5–8.1)

## 2017-04-27 LAB — CBC WITH DIFFERENTIAL/PLATELET
Basophils Absolute: 0 10*3/uL (ref 0.0–0.1)
Basophils Relative: 1 %
EOS ABS: 0.5 10*3/uL (ref 0.0–0.7)
EOS PCT: 12 %
HCT: 41.1 % (ref 39.0–52.0)
Hemoglobin: 15.2 g/dL (ref 13.0–17.0)
LYMPHS PCT: 22 %
Lymphs Abs: 0.9 10*3/uL (ref 0.7–4.0)
MCH: 31.3 pg (ref 26.0–34.0)
MCHC: 37 g/dL — ABNORMAL HIGH (ref 30.0–36.0)
MCV: 84.6 fL (ref 78.0–100.0)
Monocytes Absolute: 0.6 10*3/uL (ref 0.1–1.0)
Monocytes Relative: 13 %
Neutro Abs: 2.2 10*3/uL (ref 1.7–7.7)
Neutrophils Relative %: 52 %
PLATELETS: 235 10*3/uL (ref 150–400)
RBC: 4.86 MIL/uL (ref 4.22–5.81)
RDW: 13.7 % (ref 11.5–15.5)
WBC: 4.3 10*3/uL (ref 4.0–10.5)

## 2017-04-27 LAB — MRSA PCR SCREENING: MRSA by PCR: NEGATIVE

## 2017-04-27 MED ORDER — SULFAMETHOXAZOLE-TRIMETHOPRIM 400-80 MG PO TABS
1.0000 | ORAL_TABLET | Freq: Every day | ORAL | Status: DC
  Administered 2017-04-28 – 2017-04-29 (×2): 1 via ORAL
  Filled 2017-04-27 (×2): qty 1

## 2017-04-27 MED ORDER — ACETAMINOPHEN 325 MG PO TABS: 650.0000 mg | ORAL_TABLET | Freq: Four times a day (QID) | ORAL | Status: DC | PRN

## 2017-04-27 MED ORDER — BICTEGRAVIR-EMTRICITAB-TENOFOV 50-200-25 MG PO TABS
1.0000 | ORAL_TABLET | Freq: Every day | ORAL | Status: DC
  Administered 2017-04-28 – 2017-04-29 (×2): 1 via ORAL
  Filled 2017-04-27 (×2): qty 1

## 2017-04-27 MED ORDER — ACETAMINOPHEN 650 MG RE SUPP: 650.0000 mg | Freq: Four times a day (QID) | RECTAL | Status: DC | PRN

## 2017-04-27 NOTE — Consult Note (Addendum)
[facility]    Date of Admission:  04/27/2017   Total days of antibiotics: 0               Reason for Consult: AIDS    Referring Provider: [facility]   Assessment: AIDS Memory change   Plan: 1. Please check LP for CMV, EBV, JC viruses 2. RPR (-) last month 3. Please check LP for routine and bacterial, fungal Cx 4. Please check CSF crytpo Ag.  5. No change in ART or new anbx for now.   6. Please have neuro see 7. Please have psych see  Comment- Query if this is IRIS?  Thank you so much for this interesting consult,  Active Problems:   * No active hospital problems. *     HPI: Carl Bradley is a 27 y.o. male wiht hx of HIV+ dx 2011 however he defered tx til 2018 (his CD4 was < 200 during this period).  He was started on biktarvy ~ 6 months ago.  He was seen in ID clinic for f/u on 02-14-17 and was noted to have memory problems.  He had MRI on 4-16 that showed:  Advanced cerebral atrophy for age with associated fairly symmetric and hazy T2/FLAIR signal abnormality involving the bilateral cerebral Hessler matter. 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  By his f/u 4-18 he was having difficulty with his ADLs (bathing and changing clothes). He denies these concerns.  Due to this [facility] LP, neuro and psych eval.   HIV 1 RNA Quant (copies/mL)  Date Value  04/17/2017 32 (H)  02/14/2017 146 (H)  12/06/2016 236 (H)   CD4 T Cell Abs (/uL)  Date Value  04/17/2017 130 (L)  02/14/2017 130 (L)  12/06/2016 70 (L)     Review of Systems: Review of Systems  Constitutional: Negative for chills and fever.  Respiratory: Negative for cough and shortness of breath.   Gastrointestinal: Negative for constipation, diarrhea, nausea and vomiting.  Genitourinary: Negative for dysuria.  Neurological: Negative for sensory change, weakness and headaches.  Psychiatric/Behavioral: Negative for memory loss.  Please see  HPI. All other systems reviewed and negative.   Past Medical History:  Diagnosis Date  . HIV disease (HCC)   . Tobacco use 10/24/2016    Social History   Tobacco Use  . Smoking status: Current Every Day Smoker    Packs/day: 0.75    Types: Cigarettes  . Smokeless tobacco: Never Used  Substance Use Topics  . Alcohol use: Yes    Comment: occasional  . Drug use: Yes    Types: Marijuana    Family History  Problem Relation Age of Onset  . Healthy Mother   . Healthy Father      Medications: Scheduled:  Abtx:  Anti-infectives (From admission, onward)   None        OBJECTIVE: Blood pressure 124/75, pulse 78, temperature 98.8 F (37.1 C), temperature source Oral, SpO2 98 %.  Physical Exam  Constitutional: He is oriented to person, place, and time. He appears well-developed and well-nourished.  HENT:  Mouth/Throat: No oropharyngeal exudate.  Eyes: Pupils are equal, round, and reactive to light. EOM are normal.  Neck: Normal range of motion. Neck supple.  Cardiovascular: Normal rate, regular rhythm and normal heart sounds.  Pulmonary/Chest: Effort normal and breath sounds normal.  Abdominal: Soft. Bowel sounds are normal. There is no tenderness.  Musculoskeletal: Normal  range of motion.  Lymphadenopathy:    He has no cervical adenopathy.  Neurological: He is alert and oriented to person, place, and time. No cranial nerve deficit or sensory deficit. He exhibits normal muscle tone. Coordination normal.  Skin: Skin is warm and dry. No rash noted.  Psychiatric: He has a normal mood and affect. His behavior is normal. Thought content normal.  He is oriented to person, place, date (day, month, year). His recall is 3/3 and 3/3 after 2 minutes.   Lab Results No results found for this or any previous visit (from the past 48 hour(s)).    Component Value Date/Time   SDES BLOOD LEFT ARM 05/25/2016 2350   SPECREQUEST  05/25/2016 2350    BOTTLES DRAWN AEROBIC AND ANAEROBIC  Blood Culture adequate volume   CULT  05/25/2016 2350    NO GROWTH 5 DAYS Performed at [facility], [address] [place], [place] [address]    REPTSTATUS 05/31/2016 FINAL 05/25/2016 2350   No results found. No results found for this or any previous visit (from the past 240 hour(s)).  Microbiology: No results found for this or any previous visit (from the past 240 hour(s)).  Radiographs and labs were personally reviewed by me.   Johny Sax, MD [facility] [phone] 04/27/2017, 4:46 PM

## 2017-04-27 NOTE — H&P (Signed)
Date: 04/27/2017               Patient Name:  Carl Bradley MRN: [id number]  DOB: 1990/05/30 Age / Sex: 27 y.o., male   PCP: Blanchard Kelch, NP         Medical Service: Internal Medicine Teaching Service         Attending Physician: Dr. Sandre Kitty Elwin Mocha, MD    First Contact: Dr. Frances Furbish Pager: [phone]  Second Contact: Dr. 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  Per chart review, the patient has been experiencing a change in functioning over the past several months, including memory disturbances, difficulty with ADLs such as bathing and changing clothes, voiding on himself, as well as making impulsive and irresponsible choices with no insight. 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 No other acute intracranial abnormality or focal lesions were identified.The patient denies issues with his memory or ADLs and states he is in his normal state of health. He denies fevers, chills, headaches, blurry vision, focal weakness, chest pain, shortness of breath, nausea, vomiting, diarrhea, dysuria or hematuria. When asked if he understood why he was in the hospital, he stated "because of the  lesion on his spine," but understood he would be requiring a lumbar puncture.   He states he lives at home with his mother and grandmother. He recently quit his job because he was no getting along with [employer], site. He will go grocery  shopping with his grandmother, but typically does not handle the finances. He will sometimes cook at home if his mother does not cook for him.   Meds:  No outpatient medications have been marked as taking for the 04/27/17 encounter [facility]).     Allergies: Allergies as of 04/26/2017  . (No Known Allergies)   Past Medical History:  Diagnosis Date  . HIV disease (HCC)   . Tobacco use 10/24/2016    Family History:  Family History  Problem Relation Age of Onset  . Healthy Mother   . Healthy Father     Social History:  Social History   Tobacco Use  . Smoking status: Current Every Day Smoker    Packs/day: 0.75    Years: 10.00    Pack years: 7.50    Types: Cigarettes  . Smokeless tobacco: Never Used  Substance Use Topics  . Alcohol use: Yes    Comment: 04/27/2017 "a drink q 2-3 months"  . Drug use: Yes    Types: Marijuana    Comment: 04/27/2017 "nothing in a couple months"    Review of Systems: A complete ROS was negative except as per HPI.   Physical Exam: Blood pressure 114/74, pulse (!) 55, temperature 98.3 F (36.8 C), temperature source Oral, height 6' (1.829 m), weight 177 lb 1.9 oz (80.3 kg), SpO2 99 %. Physical Exam  Constitutional: He is oriented to person, place, and time. He appears well-developed and well-nourished. No distress.  HENT:  Head:  Normocephalic and atraumatic.  Eyes: Conjunctivae and EOM are normal. No scleral icterus.  Neck: Normal range of motion. Neck supple.  Cardiovascular: Normal rate, regular rhythm, normal heart sounds and intact distal pulses.  Pulmonary/Chest: Effort normal and breath sounds normal.  Abdominal: Soft. Bowel sounds are normal. He exhibits no distension. There is no tenderness.  Musculoskeletal: Normal range of motion. He exhibits no edema.  Neurological: He is alert and oriented to person, place, and time. He has normal strength and normal reflexes. No cranial nerve deficit or sensory deficit.  No focal  neurological deficits. Heal to shin and finger to nose testing normal.   Skin: Skin is warm and dry.  Psychiatric: Judgment and thought content normal. His affect is labile. His speech is tangential. He is hyperactive. Cognition and memory are normal.  Performed well on brief bedside MMSE, could not spell world backward and could not recall 2 out of 3 on object recall.  He is inattentive (Patient would frequently look at his phone or turn on the TV ).    EKG: none to review  CXR: none to review  Assessment & Plan by Problem: Active Problems:   Encephalopathy, unspecified  Encephalopathy  Patient with history of HIV presenting with progressively worsening neurocognitive dysfunction despite HAART therapy. 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 Currently, the patient is AOx3, without focal neurological deficits on examination. His vital signs are stable and has no signs or symptoms of infection.  -ID on board, appreciate recs -Patient will require LP for CMV, EBV, JC viruses, cryptococcus, HSV, VDRL, routine bacterial and fungal cx -Neuro consult in AM -Psychiatry consult  -Continue Biktarvy and Bactrim  -No abx for now -TSH, Vit B12 pending will f/u   HIV HIV infection diagnosed in 2011, patient defered any treatment until late 2018. Most recent CD4 count 130, HIV RNA 32,000. RPR negative as of 04/17/2017.   -continue Biktarvy  -continue Bactrim    DVT ppx: SCDs Diet: Reg  Dispo: Admit patient to Inpatient with expected length of stay greater than 2 midnights.  Signed: Toney RakesLacroce, Samantha J, MD 04/27/2017, 9:26 PM  Pager: [phone]

## 2017-04-27 NOTE — Telephone Encounter (Signed)
I called and spoke with Carl Bradley's mom this morning. Told me she did not get any calls that go through but she is home all day today. Informed bed control of need for inpatient admission still. Also D/W IM resident admitting and updated.

## 2017-04-27 NOTE — Plan of Care (Signed)
Patient admitted, awaiting LP and lab results.  Patient left room unattended and could not find way back to room. Security notified and retrieved patient and brought back to room. Patient baseline of some confusion at home

## 2017-04-28 DIAGNOSIS — F688 Other specified disorders of adult personality and behavior: Secondary | ICD-10-CM

## 2017-04-28 DIAGNOSIS — E538 Deficiency of other specified B group vitamins: Secondary | ICD-10-CM

## 2017-04-28 DIAGNOSIS — G9340 Encephalopathy, unspecified: Secondary | ICD-10-CM

## 2017-04-28 DIAGNOSIS — F1099 Alcohol use, unspecified with unspecified alcohol-induced disorder: Secondary | ICD-10-CM

## 2017-04-28 DIAGNOSIS — F1290 Cannabis use, unspecified, uncomplicated: Secondary | ICD-10-CM

## 2017-04-28 DIAGNOSIS — R450 Nervousness: Secondary | ICD-10-CM

## 2017-04-28 DIAGNOSIS — F4322 Adjustment disorder with anxiety: Secondary | ICD-10-CM

## 2017-04-28 DIAGNOSIS — F919 Conduct disorder, unspecified: Secondary | ICD-10-CM

## 2017-04-28 LAB — HIV ANTIBODY (ROUTINE TESTING W REFLEX): HIV Screen 4th Generation wRfx: REACTIVE — AB

## 2017-04-28 LAB — GLUCOSE, CAPILLARY
Glucose-Capillary: 121 mg/dL — ABNORMAL HIGH (ref 65–99)
Glucose-Capillary: 84 mg/dL (ref 65–99)

## 2017-04-28 LAB — TSH: TSH: 4.94 u[IU]/mL — ABNORMAL HIGH (ref 0.350–4.500)

## 2017-04-28 LAB — HIV 1/2 AB DIFFERENTIATION
HIV 1 Ab: POSITIVE — AB
HIV 2 Ab: NEGATIVE

## 2017-04-28 LAB — APTT: aPTT: 38 seconds — ABNORMAL HIGH (ref 24–36)

## 2017-04-28 LAB — PROTIME-INR
INR: 0.93
Prothrombin Time: 12.4 seconds (ref 11.4–15.2)

## 2017-04-28 LAB — VITAMIN B12: Vitamin B-12: 132 pg/mL — ABNORMAL LOW (ref 180–914)

## 2017-04-28 MED ORDER — VITAMIN B-12 1000 MCG PO TABS
1000.0000 ug | ORAL_TABLET | Freq: Every day | ORAL | Status: DC
  Administered 2017-04-29: 1000 ug via ORAL
  Filled 2017-04-28: qty 1

## 2017-04-28 MED ORDER — CYANOCOBALAMIN 1000 MCG/ML IJ SOLN
1000.0000 ug | Freq: Once | INTRAMUSCULAR | Status: AC
  Administered 2017-04-28: 1000 ug via INTRAMUSCULAR
  Filled 2017-04-28: qty 1

## 2017-04-28 NOTE — Consult Note (Signed)
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 Sandre Kittyaines  CC: Neurocognitive decline  History is obtained from: Chart review, patient  HPI: Carl Bradley is a 27 y.o. male past medical history of HIV diagnosed in 2011 when he tested positive at a blood donation, with his HIV RNA with viral load 197,000 on initial examination and initial CD4 count less than 200, who was not on any treatment until 2018, when he started antiretroviral therapy, and has been following up with infectious diseases center, sent to the hospital for further diagnostic evaluation because of concerns for neurocognitive decline.  According to notes reviewed from Rexene AlbertsStephanie Dixon, NP ID service and Dr. Ninetta LightsHatcher, ID specialist, he has been compliant with his medications but has exhibited symptoms that were concerning for CNS involvement including mood lability, memory loss, poor focus and attention.  This prompted the ID team to pursue an MRI of the brain, which was done on 04/24/2017 that demonstrated advanced for age cerebral atrophy with associated symmetric T2/FLAIR signal abnormalities in bilateral cerebral Stirling matter raising possibility of HIV encephalitis. He was requested to come in for an inpatient admission to get spinal tap and send CSF for studies as well as obtain neurology and psychiatry consultations. Upon entering the patient's room, he was on his cell phone, answered all questions appropriately but avoided eye contact. He was aware that he has had a brain MRI that is abnormal but did not seem very concerned about it.  When I asked him if he has any neurological symptoms including tingling numbness weakness headaches or visual symptoms, he denied all of those.  When I asked him if he has had any memory trouble he denied any memory trouble himself but review of chart confirms presence of memory issues. Patient's mother presumably is  on her way to the hospital, and I will touch base with her upon her arrival. Patient is eager to leave the hospital and get home as he said that he is unable to enjoy the TV here and he would like to watch his own TV and movies on his own DVD player. He denied any ongoing shortness of breath, chest pain, nausea vomiting.  Denied any headaches.  Denied any visual symptoms.   ROS: ROS was performed and is negative except as noted in the HPI.   Past Medical History:  Diagnosis Date  . HIV disease (HCC)   . Tobacco use 10/24/2016   Family History  Problem Relation Age of Onset  . Healthy Mother   . Healthy Father    Social History:   reports that he has been smoking cigarettes.  He has a 7.50 pack-year smoking history. He has never used smokeless tobacco. He reports that he drinks alcohol. He reports that he has current or past drug history. Drug: Marijuana.  Medications  Current Facility-Administered Medications:  .  acetaminophen (TYLENOL) tablet 650 mg, 650 mg, Oral, Q6H PRN **OR** acetaminophen (TYLENOL) suppository 650 mg, 650 mg, Rectal, Q6H PRN, Samuella CotaSvalina, Gorica, MD .  bictegravir-emtricitabine-tenofovir AF (BIKTARVY) 50-200-25 MG per tablet 1 tablet, 1 tablet, Oral, Daily, Nyra MarketSvalina, Gorica, MD, 1 tablet at 04/28/17 0912 .  sulfamethoxazole-trimethoprim (BACTRIM,SEPTRA) 400-80 MG per tablet 1 tablet, 1 tablet, Oral, Daily, Svalina, Gorica, MD, 1 tablet at 04/28/17 0912  Exam: Current vital signs: BP 112/79 (BP Location: Right Arm)   Pulse 65   Temp 98.3 F (36.8 C) (Oral)   Ht 6' (1.829 m)  Wt 80.3 kg (177 lb 1.9 oz)   SpO2 100%   BMI 24.02 kg/m  Vital signs in last 24 hours: Temp:  [98.3 F (36.8 C)-98.8 F (37.1 C)] 98.3 F (36.8 C) (04/20 9147) Pulse Rate:  [55-78] 65 (04/20 0614) BP: (112-124)/(74-79) 112/79 (04/20 0614) SpO2:  [98 %-100 %] 100 % (04/20 0614) Weight:  [80.3 kg (177 lb 1.9 oz)] 80.3 kg (177 lb 1.9 oz) (04/19 1630)  GENERAL: Awake, alert in  NAD HEENT: - Normocephalic and atraumatic, dry mm, no LN++, no Thyromegally.  Oral thrush+. No neck stiffness. Optic discs not visualized. LUNGS - Clear to auscultation bilaterally with no wheezes CV - S1S2 RRR, no m/r/g, equal pulses bilaterally. ABDOMEN - Soft, nontender, nondistended with normoactive BS Ext: warm, well perfused, intact peripheral pulses  NEURO:  Mental Status: AA&Ox3  Slightly impaired attention and concentration.  Was able to spell the word world forwards for me but upon asking him to spell it backwards, he missed the last letter. Registration for 3 words 3/3, recall for 3 words 3/3. Formal MMSE not conducted at bedside.  Deferred for outpatient evaluation. He seemed to be focused on his cell phone and avoided eye contact.  He did not seem to have great insight into the severity of his imaging findings, even when I discussed them in detail with him. Language: speech is clear.  Naming, repetition, fluency, and comprehension intact Cranial Nerves: PERRL. EOMI, visual fields full, no facial asymmetry, facial sensation intact, hearing intact, tongue/uvula/soft palate midline, normal sternocleidomastoid and trapezius muscle strength. No evidence of tongue atrophy or fibrillations Motor: 5/5 with no vertical drift Tone: is normal and bulk is normal Sensation- Intact to light touch bilaterally Coordination: FTN intact bilaterally Gait- deferred  Labs I have reviewed labs in epic and the results pertinent to this consultation are: CBC    Component Value Date/Time   WBC 4.3 04/27/2017 1845   RBC 4.86 04/27/2017 1845   HGB 15.2 04/27/2017 1845   HCT 41.1 04/27/2017 1845   PLT 235 04/27/2017 1845   MCV 84.6 04/27/2017 1845   MCH 31.3 04/27/2017 1845   MCHC 37.0 (H) 04/27/2017 1845   RDW 13.7 04/27/2017 1845   LYMPHSABS 0.9 04/27/2017 1845   MONOABS 0.6 04/27/2017 1845   EOSABS 0.5 04/27/2017 1845   BASOSABS 0.0 04/27/2017 1845    CMP     Component Value Date/Time    NA 137 04/27/2017 1845   K 3.9 04/27/2017 1845   CL 104 04/27/2017 1845   CO2 24 04/27/2017 1845   GLUCOSE 91 04/27/2017 1845   BUN 10 04/27/2017 1845   CREATININE 1.24 04/27/2017 1845   CREATININE 0.92 10/24/2016 1155   CALCIUM 9.2 04/27/2017 1845   PROT 7.2 04/27/2017 1845   ALBUMIN 4.0 04/27/2017 1845   AST 24 04/27/2017 1845   ALT 16 (L) 04/27/2017 1845   ALKPHOS 94 04/27/2017 1845   BILITOT 0.7 04/27/2017 1845   GFRNONAA >60 04/27/2017 1845   GFRNONAA 114 10/24/2016 1155   GFRAA >60 04/27/2017 1845   GFRAA 133 10/24/2016 1155  B12-132 CD4-130  Imaging I have reviewed the images obtained: MRI examination of the brain (MR Brain w+w/o cont)-shows extensive areas of T2 and flair signal abnormality in bilateral cerebral Smeltz matter.  Also has advanced for age cerebral atrophy.  No evidence of acute stroke or bleed. The T2 and flair changes are most prominent in bilateral frontal regions.  Assessment:  28 year old man with past history of HIV diagnosed in  2011 who was not on treatment till 2018, with most recent HIV RNA quantitative 32, positive HIV antibody, CD4 count 130 stable from February, presenting for evaluation of possible neurocognitive decline and an MRI of the brain that is abnormal. On my review of the MRI of the brain, it shows extensive Flemmer matter disease with advanced for age cerebral atrophy.  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  Frontal predominance of lesions could also explain the personality changes as well as the poor insight that he seems to exhibit.   I do not have a very clear timeline on his neurocognitive decline to at this point to tie this to IRIS because his antiretroviral treatment was started about 6 months ago and I am not sure how long the cognitive issues have been ongoing. It is reasonable to evaluate the CSF for opportunistic infections as well as HIV as that will determine future treatment. Other differentials based on  the scan include PML, for the diagnosis of which JC virus should be tested in the CSF. Although MRI did not show any enhancement, evaluation for a CNS lymphoma should also be done with CSF cytology and flow cytometry.  Patients with HIV can have aberrant enhancement patterns, which are different from uninfected patients. Some of his short-term memory issues can also be attributed to his vitamin B12 deficiency-his current vitamin B12 level is 132.   Impression: Evaluate for HIV encephalitis Evaluate for opportunistic CNS infections Evaluate for personality disturbance in the setting of HIV/AIDS Evaluate for CNS inflammatory markers Vitamin B12 deficiency  Recommendations: -Agree with ID recommendations of LP to check CSF for CMV, EBV, JC virus along with cell count, glucose, protein, Gram stain, bacterial and fungal cultures.  Also agree with checking CSF for cryptococcal antigen although he does not have clinical features of cryptococcal meningitis.  -In addition to the above-mentioned tests, I would also recommend adding cytology and flow cytometry to the CSF analysis given the preponderance of lymphoma in HIV patients. -Upon discharge, he should follow-up with outpatient neurology -Also agree with obtaining a psychiatry consult at this time. -In addition to checking for opportunistic infections and other CNS infections, reversible causes of memory loss should also be looked at.  I would recommend checking vitamin TSH, hemoglobin A 1C.  Thiamine levels can also be checked. -Replace B12 parenterally as well as orally -goal levels over 450.  Continue long-term replacement. -ART per ID  Patient anxious to be discharged and seems to have limited insight in to his condition. I would encourage to get his mother involved to speak with him and convince him to stay to get the work up completed.  Please call with questions as needed.  -- Milon Dikes, MD [facility] Pager:  [phone] If 7pm to 7am, please call on call as listed on AMION.

## 2017-04-28 NOTE — Consult Note (Addendum)
[facility] Face-to-Face Psychiatry Consult   Reason for Consult: ''HIV with behavior changes.'' Referring Physician:  Dr. Shan Levans Patient Identification: Ashwath Lasch III MRN:  [id number] Principal Diagnosis: Adjustment disorder with anxiety Diagnosis:   Patient Active Problem List   Diagnosis Date Noted  . Encephalopathy, unspecified [G93.40] 04/27/2017  . Neurocognitive disorder [R41.9] 02/21/2017  . Tobacco use [Z72.0] 10/24/2016  . Healthcare maintenance [Z00.00] 10/24/2016  . Adjustment disorder with anxiety [F43.22] 11/23/2009  . AIDS (acquired immune deficiency syndrome) ([place]) [B20] 08/06/2009    Total Time spent with patient: 45 minutes  Subjective:   '' I was admitted here for a procedure.''  HPI: Symeon Puleo III is a 27 y.o. Man who denies prior history of mental illness or substance abuse. However, he reports past medical history of HIV diagnosed in 2011 when he tested positive at a blood donation but did not start treatment for same until 2018. He states that he was informed that he was sent to the hospital for a procedure due concerns for neurocognitive decline. Patient denies any psychiatric symptoms such as anxiety, depression, SI/HI,  psychosis or delusional thinking. He was found sitting outside his room talking on the phone with his friends. Patient is alert and oriented and answered all the question I asked him approprietly. He did not display any agitation, mood swing or aggressive behavior but says he is anxious to go back home: ''I am tired of staying in the hospital, I have got to go home.''  Past Psychiatric History: none reported by the patient  Risk to Self: Is patient at risk for suicide?: No Risk to Others:   Prior Inpatient Therapy:   Prior Outpatient Therapy:    Past Medical History:  Past Medical History:  Diagnosis Date  . HIV disease ([place])   . Tobacco use 10/24/2016    Past Surgical History:  Procedure Laterality Date  . INCISION AND DRAINAGE PERIRECTAL  ABSCESS N/A 07/29/2015   Procedure: IRRIGATION AND DEBRIDEMENT PERIRECTAL ABSCESS;  Surgeon: Excell Seltzer, MD;  Location: [facility];  Service: General;  Laterality: N/A;   Family History:  Family History  Problem Relation Age of Onset  . Healthy Mother   . Healthy Father    Family Psychiatric  History:  Social History:  Social History   Substance and Sexual Activity  Alcohol Use Yes   Comment: 04/27/2017 "a drink q 2-3 months"     Social History   Substance and Sexual Activity  Drug Use Yes  . Types: Marijuana   Comment: 04/27/2017 "nothing in a couple months"    Social History   Socioeconomic History  . Marital status: Single    Spouse name: Not on file  . Number of children: Not on file  . Years of education: Not on file  . Highest education level: Not on file  Occupational History  . Not on file  Social Needs  . Financial resource strain: Not on file  . Food insecurity:    Worry: Not on file    Inability: Not on file  . Transportation needs:    Medical: Not on file    Non-medical: Not on file  Tobacco Use  . Smoking status: Current Every Day Smoker    Packs/day: 0.75    Years: 10.00    Pack years: 7.50    Types: Cigarettes  . Smokeless tobacco: Never Used  Substance and Sexual Activity  . Alcohol use: Yes    Comment: 04/27/2017 "a drink q 2-3 months"  . Drug use:  Yes    Types: Marijuana    Comment: 04/27/2017 "nothing in a couple months"  . Sexual activity: Not Currently    Birth control/protection: None  Lifestyle  . Physical activity:    Days per week: Not on file    Minutes per session: Not on file  . Stress: Not on file  Relationships  . Social connections:    Talks on phone: Not on file    Gets together: Not on file    Attends religious service: Not on file    Active member of club or organization: Not on file    Attends meetings of clubs or organizations: Not on file    Relationship status: Not on file  Other Topics Concern  . Not on file   Social History Narrative  . Not on file   Additional Social History:    Allergies:  No Known Allergies  Labs:  Results for orders placed or performed during the hospital encounter of 04/27/17 (from the past 48 hour(s))  Comprehensive metabolic panel     Status: Abnormal   Collection Time: 04/27/17  6:45 PM  Result Value Ref Range   Sodium 137 135 - 145 mmol/L   Potassium 3.9 3.5 - 5.1 mmol/L   Chloride 104 101 - 111 mmol/L   CO2 24 22 - 32 mmol/L   Glucose, Bld 91 65 - 99 mg/dL   BUN 10 6 - 20 mg/dL   Creatinine, Ser 1.24 0.61 - 1.24 mg/dL   Calcium 9.2 8.9 - 10.3 mg/dL   Total Protein 7.2 6.5 - 8.1 g/dL   Albumin 4.0 3.5 - 5.0 g/dL   AST 24 15 - 41 U/L   ALT 16 (L) 17 - 63 U/L   Alkaline Phosphatase 94 38 - 126 U/L   Total Bilirubin 0.7 0.3 - 1.2 mg/dL   GFR calc non Af Amer >60 >60 mL/min   GFR calc Af Amer >60 >60 mL/min    Comment: (NOTE) The eGFR has been calculated using the CKD EPI equation. This calculation has not been validated in all clinical situations. eGFR's persistently <60 mL/min signify possible Chronic Kidney Disease.    Anion gap 9 5 - 15    Comment: Performed at [facility] [address], [place], [place] [address]  CBC with Differential     Status: Abnormal   Collection Time: 04/27/17  6:45 PM  Result Value Ref Range   WBC 4.3 4.0 - 10.5 K/uL   RBC 4.86 4.22 - 5.81 MIL/uL   Hemoglobin 15.2 13.0 - 17.0 g/dL   HCT 41.1 39.0 - 52.0 %   MCV 84.6 78.0 - 100.0 fL   MCH 31.3 26.0 - 34.0 pg   MCHC 37.0 (H) 30.0 - 36.0 g/dL   RDW 13.7 11.5 - 15.5 %   Platelets 235 150 - 400 K/uL   Neutrophils Relative % 52 %   Neutro Abs 2.2 1.7 - 7.7 K/uL   Lymphocytes Relative 22 %   Lymphs Abs 0.9 0.7 - 4.0 K/uL   Monocytes Relative 13 %   Monocytes Absolute 0.6 0.1 - 1.0 K/uL   Eosinophils Relative 12 %   Eosinophils Absolute 0.5 0.0 - 0.7 K/uL   Basophils Relative 1 %   Basophils Absolute 0.0 0.0 - 0.1 K/uL    Comment: Performed at [facility] [address] [place], [place] [address]  MRSA PCR Screening     Status: None   Collection Time: 04/27/17  6:57 PM  Result Value Ref Range   MRSA by PCR NEGATIVE NEGATIVE    Comment:        The GeneXpert MRSA Assay (FDA approved for NASAL specimens only), is one component of a comprehensive MRSA colonization surveillance program. It is not intended to diagnose MRSA infection nor to guide or monitor treatment for MRSA infections. Performed at [facility], [place] [address], [place], [place] [address]   Protime-INR     Status: None   Collection Time: 04/28/17  6:27 AM  Result Value Ref Range   Prothrombin Time 12.4 11.4 - 15.2 seconds   INR 0.93     Comment: Performed at [facility] [address], [place], [place] [address]  APTT     Status: Abnormal   Collection Time: 04/28/17  6:27 AM  Result Value Ref Range   aPTT 38 (H) 24 - 36 seconds    Comment:        IF BASELINE aPTT IS ELEVATED, SUGGEST PATIENT RISK ASSESSMENT BE USED TO DETERMINE APPROPRIATE ANTICOAGULANT THERAPY. Performed at [facility], [place] [address], [place], [place] [address]   TSH     Status: Abnormal   Collection Time: 04/28/17  6:27 AM  Result Value Ref Range   TSH 4.940 (H) 0.350 - 4.500 uIU/mL    Comment: Performed by a 3rd Generation assay with a functional sensitivity of <=0.01 uIU/mL. Performed at [facility], [place] [address] [place], [place] [address]   Vitamin B12     Status: Abnormal   Collection Time: 04/28/17  6:27 AM  Result Value Ref Range   Vitamin B-12 132 (L) 180 - 914 pg/mL    Comment: (NOTE) This assay is not validated for testing neonatal or myeloproliferative syndrome specimens for Vitamin B12 levels. Performed at [facility], [place] [address] [place],  [address]   Glucose, capillary     Status: Abnormal   Collection Time: 04/28/17 12:15 PM  Result Value Ref Range   Glucose-Capillary 121 (H) 65 - 99 mg/dL   Comment 1 Notify RN     Comment 2 Document in Chart     Current Facility-Administered Medications  Medication Dose Route Frequency Provider Last Rate Last Dose  . acetaminophen (TYLENOL) tablet 650 mg  650 mg Oral Q6H PRN Alphonzo Grieve, MD       Or  . acetaminophen (TYLENOL) suppository 650 mg  650 mg Rectal Q6H PRN Alphonzo Grieve, MD      . bictegravir-emtricitabine-tenofovir AF (BIKTARVY) 50-200-25 MG per tablet 1 tablet  1 tablet Oral Daily Alphonzo Grieve, MD   1 tablet at 04/28/17 0912  . sulfamethoxazole-trimethoprim (BACTRIM,SEPTRA) 400-80 MG per tablet 1 tablet  1 tablet Oral Daily Alphonzo Grieve, MD   1 tablet at 04/28/17 0912    Musculoskeletal: Strength & Muscle Tone: within normal limits Gait & Station: normal Patient leans: N/A  Psychiatric Specialty Exam: Physical Exam  Psychiatric: His speech is normal and behavior is normal. Judgment and thought content normal. His mood appears anxious. Cognition and memory are normal.    Review of Systems  Constitutional: Negative.   HENT: Negative.   Eyes: Negative.   Respiratory: Negative.   Cardiovascular: Negative.   Gastrointestinal: Negative.   Genitourinary: Negative.   Musculoskeletal: Negative.   Skin: Negative.   Neurological: Negative.   Endo/Heme/Allergies: Negative.   Psychiatric/Behavioral: The patient is nervous/anxious.     Blood pressure 112/79, pulse 65, temperature 98.3 F (36.8  C), temperature source Oral, height 6' (1.829 m), weight 80.3 kg (177 lb 1.9 oz), SpO2 100 %.Body mass index is 24.02 kg/m.  General Appearance: Casual  Eye Contact:  Good  Speech:  Clear and Coherent  Volume:  Normal  Mood:  Euthymic  Affect:  anxious  Thought Process:  Coherent and Descriptions of Associations: Intact  Orientation:  Full (Time, Place, and Person)  Thought Content:  Logical  Suicidal Thoughts:  No  Homicidal Thoughts:  No  Memory:  Immediate;   Good Recent;   Good Remote;   Good  Judgement:  Fair  Insight:  Fair   Psychomotor Activity:  Normal  Concentration:  Concentration: Fair and Attention Span: Fair  Recall:  Good  Fund of Knowledge:  Good  Language:  Good  Akathisia:  No  Handed:  Right  AIMS (if indicated):     Assets:  Communication Skills Desire for Improvement  ADL's:  Intact  Cognition:  WNL  Sleep:   fair     Treatment Plan Summary: 27 year old man who denies history of mental illness. However, I have been asked to do a psychiatric consult due to recent behavior changes. Per record, treating physician and ID are worries about his memory. At, this point patient memory appear intact and he did not display any abnormal behavior during my evaluation.  Recommendations: -Patient may benefit from Neurological evaluation  -May benefit from referral for Cognitive psychological evaluation upon discharge. -Psychiatric service signing out.  Disposition: No evidence of imminent risk to self or others at present.   Unit social worker may assist with referral for Cognitive psychological testing upon discharge  Corena Pilgrim, MD 04/28/2017 1:38 PM

## 2017-04-28 NOTE — Progress Notes (Signed)
   Subjective: Pt reports feeling fine, says he has not noticed any of the changes his mother is worried about.  Denies any headaches dizziness or visual disturbances.  We discussed his need for a lumbar puncture and he states "might as well get it since I'm here".    Objective:  Vital signs in last 24 hours: Vitals:   04/27/17 1613 04/27/17 1630 04/27/17 2009 04/28/17 0614  BP: 124/75  114/74 112/79  Pulse: 78  (!) 55 65  Temp: 98.8 F (37.1 C)  98.3 F (36.8 C) 98.3 F (36.8 C)  TempSrc: Oral  Oral Oral  SpO2: 98%  99% 100%  Weight:  177 lb 1.9 oz (80.3 kg)    Height:  6' (1.829 m)     Physical Exam  Constitutional: He is oriented to person, place, and time. He appears well-developed and well-nourished.  Eyes: Right eye exhibits no discharge. Left eye exhibits no discharge. No scleral icterus.  Cardiovascular: Normal rate, regular rhythm, normal heart sounds and intact distal pulses. Exam reveals no gallop and no friction rub.  No murmur heard. Pulmonary/Chest: Effort normal and breath sounds normal. No respiratory distress. He has no wheezes. He has no rales.  Abdominal: Soft. Bowel sounds are normal. He exhibits no distension and no mass. There is no tenderness. There is no guarding.  Neurological: He is alert and oriented to person, place, and time.  Psychiatric: He exhibits normal recent memory and normal remote memory. He is inattentive.    Assessment/Plan:  Principal Problem:   Adjustment disorder with anxiety Active Problems:   Encephalopathy, unspecified  Behavioral disturbance/personality changes: in setting of treatment for AIDS after longstanding untreated HIV.  DDx includes opportunistic infection, HIV encephalitis, lymphoma, IRIS.    -LP and multiple CSF studies to evaluate above -Neurology and Psychiatry consulted appreciate recommendations -will treat low B12 -follow TSH with a free T4 in am -will check A1C, Thiamine  B12 deficiency  -1000mg  B12  injection followed by oral 1000mg  daily  HIV: diagnosed in 2011 progressed to AIDS, began treatment 6 months ago.  Most recent CD4 count 130, HIV RNA 32,000. RPR negative as of 04/17/2017.   -continue Biktarvy -continue Bactrim  Dispo: Anticipated discharge in approximately 1-2 day(s).   Angelita InglesWinfrey, Calden Dorsey B, MD 04/28/2017, 2:30 PM Thornell MuleBrandon Francheska Villeda MD PGY-1 Internal Medicine Pager # [phone]

## 2017-04-29 LAB — CBC
HCT: 41.6 % (ref 39.0–52.0)
Hemoglobin: 15.2 g/dL (ref 13.0–17.0)
MCH: 30.9 pg (ref 26.0–34.0)
MCHC: 36.5 g/dL — ABNORMAL HIGH (ref 30.0–36.0)
MCV: 84.6 fL (ref 78.0–100.0)
Platelets: 209 10*3/uL (ref 150–400)
RBC: 4.92 MIL/uL (ref 4.22–5.81)
RDW: 13.5 % (ref 11.5–15.5)
WBC: 3.6 10*3/uL — ABNORMAL LOW (ref 4.0–10.5)

## 2017-04-29 LAB — BASIC METABOLIC PANEL
Anion gap: 8 (ref 5–15)
BUN: 5 mg/dL — ABNORMAL LOW (ref 6–20)
CO2: 27 mmol/L (ref 22–32)
Calcium: 9.4 mg/dL (ref 8.9–10.3)
Chloride: 100 mmol/L — ABNORMAL LOW (ref 101–111)
Creatinine, Ser: 1.18 mg/dL (ref 0.61–1.24)
GFR calc Af Amer: >60 mL/min (ref >60)
GFR calc non Af Amer: >60 mL/min (ref >60)
Glucose, Bld: 88 mg/dL (ref 65–99)
Potassium: 4 mmol/L (ref 3.5–5.1)
Sodium: 135 mmol/L (ref 135–145)

## 2017-04-29 LAB — T4, FREE: Free T4: 0.75 ng/dL (ref 0.61–1.12)

## 2017-04-29 LAB — CRYPTOCOCCAL ANTIGEN: CRYPTO AG: NEGATIVE

## 2017-04-29 LAB — HEMOGLOBIN A1C
Hgb A1c MFr Bld: 4.3 % — ABNORMAL LOW (ref 4.8–5.6)
Mean Plasma Glucose: 76.71 mg/dL

## 2017-04-29 LAB — CSF CELL COUNT WITH DIFFERENTIAL
RBC Count, CSF: 5125 /mm3 — ABNORMAL HIGH
RBC Count, CSF: 86 /mm3 — ABNORMAL HIGH
Tube #: 1
Tube #: 4
WBC, CSF: 2 /mm3 (ref 0–5)
WBC, CSF: 3 /mm3 (ref 0–5)

## 2017-04-29 LAB — GLUCOSE, CAPILLARY
Glucose-Capillary: 102 mg/dL — ABNORMAL HIGH (ref 65–99)
Glucose-Capillary: 128 mg/dL — ABNORMAL HIGH (ref 65–99)

## 2017-04-29 LAB — CRYPTOCOCCAL ANTIGEN, CSF: Crypto Ag: NEGATIVE

## 2017-04-29 LAB — PROTEIN AND GLUCOSE, CSF
Glucose, CSF: 52 mg/dL (ref 40–70)
Total  Protein, CSF: 52 mg/dL — ABNORMAL HIGH (ref 15–45)

## 2017-04-29 MED ORDER — CYANOCOBALAMIN 1000 MCG PO TABS: 1000.0000 ug | ORAL_TABLET | Freq: Every day | ORAL | 0 refills | Status: DC

## 2017-04-29 MED ORDER — LIDOCAINE 1% INJECTION FOR CIRCUMCISION
10.0000 mL | INJECTION | Freq: Once | INTRAVENOUS | Status: DC
  Filled 2017-04-29: qty 10

## 2017-04-29 MED ORDER — LIDOCAINE HCL (PF) 1 % IJ SOLN
INTRAMUSCULAR | Status: AC
  Filled 2017-04-29: qty 5

## 2017-04-29 MED ORDER — LIDOCAINE HCL (PF) 1 % IJ SOLN: 10.0000 mL | Freq: Once | INTRAMUSCULAR | Status: DC

## 2017-04-29 NOTE — Discharge Instructions (Signed)
Please take vitamin B12 1 pill daily after discharge

## 2017-04-29 NOTE — Progress Notes (Addendum)
LP done  Official procedure note to follow. Fluid sent to lab. Pt tolerated well. Prelim results negative for infection. Protein mildly elevated at 52. Tube 1 had 5000 red cells, tube 4 had 86 red cells indicating traumatic tap. Total glucose 52  Further recommendations per ID. Neurology services will be available as needed.  Please call with questions.   -- Milon DikesAshish Carel Carrier, MD [facility] Pager: [phone] If 7pm to 7am, please call on call as listed on AMION.

## 2017-04-29 NOTE — Progress Notes (Signed)
   Subjective: Patient evaluated this morning. He was resting in bed. He denies any acute events overnight.  Objective:  Vital signs in last 24 hours: Vitals:   04/28/17 1945 04/29/17 0443 04/29/17 0445 04/29/17 0521  BP: 111/69 (!) 86/74 (!) 96/58 106/62  Pulse: 61 68    Temp: 98.3 F (36.8 C) 97.8 F (36.6 C)    TempSrc: Oral Oral    SpO2: 100% 99%    Weight:      Height:       Physical Exam  Constitutional: He is well-developed, well-nourished, and in no distress.  Cardiovascular: Normal rate, regular rhythm and normal heart sounds. Exam reveals no gallop and no friction rub.  No murmur heard. Pulmonary/Chest: Effort normal and breath sounds normal. No respiratory distress. He has no wheezes. He has no rales.  Musculoskeletal: He exhibits no edema.  Skin: Skin is warm and dry.  Psychiatric: Mood, affect and judgment normal.     Assessment/Plan:  Principal Problem:   Encephalopathy, unspecified Active Problems:   AIDS (acquired immune deficiency syndrome) ([facility])   Adjustment disorder with anxiety   Tobacco use   Neurocognitive disorder  Behavioral disturbance/personality changes: in setting of treatment for AIDS after longstanding untreated HIV.  DDx includes opportunistic infection, HIV encephalitis, lymphoma, IRIS.    Neurology and ID consulted and recommended LP to check CSF for CMV, EBV, JC virus along with cell count, glucose, protein, Gram stain, bacterial and fungal cultures and cryptococcal antigen.  Also recommended adding cytology and flow cytometry to the CSF analysis given the preponderance of lymphoma in HIV patients. Psychiatry was also consulted and recommends cognitive psychological evaluation at discharge. -LP and multiple CSF studies to evaluate above -Neurology following - Free T4 and B1 pending  B12 deficiency B12 132, goal levels over 450 -1000mg  B12 injection followed by oral 1000mg  daily  HIV Diagnosed in 2011 progressed to AIDS, began  treatment 6 months ago.  Most recent CD4 count 130, HIV RNA 32,000. RPR negative as of 04/17/2017.  -continue Biktarvy -continue Bactrim  Dispo: Anticipated discharge pending Lumbar puncture.   Geralyn CorwinHoffman, Iyauna Sing [place], DO 04/29/2017, 7:25 AM Pager: [phone]

## 2017-04-29 NOTE — Procedures (Signed)
Procedure Note Procedure - Lumbar Puncture Indication - HIV encephalitis Anesthesia - local 1% lidocaine w/ epi Informed consent was obtained from the patient.   The area was prepped and draped in the usual sterile fashion.  Using landmarks, a 22 guage spinal needle was inserted in the L4-L5 innerspace.  The stylet was removed and the opening pressure was not measured. 11cc of clear fluid was collected and sent for routine studies. CSF was also sent for HSV and EBV PCR. The patient tolerated the procedure well. There was minimal blood loss or hematoma.  -- Milon DikesAshish Mack Alvidrez, MD [facility] Pager: [phone] If 7pm to 7am, please call on call as listed on AMION.

## 2017-04-29 NOTE — Progress Notes (Signed)
2000 pt insisted on leaving hospital. Selena BattenKim, RN contacted MD and he agreed to discharge patient. She reviewed the discharge papers with the patient and his mother. Pt d/cd the IV by himself.

## 2017-04-29 NOTE — Progress Notes (Addendum)
Neurology Progress Note   S:// No change   O:// Current vital signs: BP 106/62 (BP Location: Right Arm)   Pulse 68   Temp 97.8 F (36.6 C) (Oral)   Ht 6' (1.829 m)   Wt 80.3 kg (177 lb 1.9 oz)   SpO2 99%   BMI 24.02 kg/m  Vital signs in last 24 hours: Temp:  [97.8 F (36.6 C)-98.3 F (36.8 C)] 97.8 F (36.6 C) (04/21 0443) Pulse Rate:  [61-68] 68 (04/21 0443) BP: (86-111)/(58-74) 106/62 (04/21 0521) SpO2:  [99 %-100 %] 99 % (04/21 0443) Unchanged GENERAL: Awake, alert in NAD HEENT: - Normocephalic and atraumatic, dry mm, no LN++, no Thyromegally.  Oral thrush+. No neck stiffness. Optic discs not visualized. LUNGS - Clear to auscultation bilaterally with no wheezes CV - S1S2 RRR, no m/r/g, equal pulses bilaterally. ABDOMEN - Soft, nontender, nondistended with normoactive BS Ext: warm, well perfused, intact peripheral pulses NEURO:  Mental Status: AA&Ox3  Slightly impaired attention and concentration.  Was able to spell the word world forwards for me but upon asking him to spell it backwards, he missed the last letter. Registration for 3 words 3/3, recall for 3 words 3/3. Formal MMSE not conducted at bedside.  Deferred for outpatient evaluation. He seemed to be focused on his cell phone and avoided eye contact.  He did not seem to have great insight into the severity of his imaging findings, even when I discussed them in detail with him. Language: speech is clear.  Naming, repetition, fluency, and comprehension intact Cranial Nerves: PERRL. EOMI, visual fields full, no facial asymmetry, facial sensation intact, hearing intact, tongue/uvula/soft palate midline, normal sternocleidomastoid and trapezius muscle strength. No evidence of tongue atrophy or fibrillations Motor: 5/5 with no vertical drift Tone: is normal and bulk is normal Sensation- Intact to light touch bilaterally Coordination: FTN intact bilaterally Gait- deferred  Medications  Current Facility-Administered  Medications:  .  acetaminophen (TYLENOL) tablet 650 mg, 650 mg, Oral, Q6H PRN **OR** acetaminophen (TYLENOL) suppository 650 mg, 650 mg, Rectal, Q6H PRN, Samuella Cota, Gorica, MD .  bictegravir-emtricitabine-tenofovir AF (BIKTARVY) 50-200-25 MG per tablet 1 tablet, 1 tablet, Oral, Daily, Nyra Market, MD, 1 tablet at 04/29/17 0938 .  sulfamethoxazole-trimethoprim (BACTRIM,SEPTRA) 400-80 MG per tablet 1 tablet, 1 tablet, Oral, Daily, Nyra Market, MD, 1 tablet at 04/29/17 0939 .  vitamin B-12 (CYANOCOBALAMIN) tablet 1,000 mcg, 1,000 mcg, Oral, Daily, Angelita Ingles, MD, 1,000 mcg at 04/29/17 0939 Labs CBC    Component Value Date/Time   WBC 3.6 (L) 04/29/2017 0736   RBC 4.92 04/29/2017 0736   HGB 15.2 04/29/2017 0736   HCT 41.6 04/29/2017 0736   PLT 209 04/29/2017 0736   MCV 84.6 04/29/2017 0736   MCH 30.9 04/29/2017 0736   MCHC 36.5 (H) 04/29/2017 0736   RDW 13.5 04/29/2017 0736   LYMPHSABS 0.9 04/27/2017 1845   MONOABS 0.6 04/27/2017 1845   EOSABS 0.5 04/27/2017 1845   BASOSABS 0.0 04/27/2017 1845    CMP     Component Value Date/Time   NA 135 04/29/2017 0736   K 4.0 04/29/2017 0736   CL 100 (L) 04/29/2017 0736   CO2 27 04/29/2017 0736   GLUCOSE 88 04/29/2017 0736   BUN 5 (L) 04/29/2017 0736   CREATININE 1.18 04/29/2017 0736   CREATININE 0.92 10/24/2016 1155   CALCIUM 9.4 04/29/2017 0736   PROT 7.2 04/27/2017 1845   ALBUMIN 4.0 04/27/2017 1845   AST 24 04/27/2017 1845   ALT 16 (L) 04/27/2017 1845  ALKPHOS 94 04/27/2017 1845   BILITOT 0.7 04/27/2017 1845   GFRNONAA >60 04/29/2017 0736   GFRNONAA 114 10/24/2016 1155   GFRAA >60 04/29/2017 0736   GFRAA 133 10/24/2016 1155    glycosylated hemoglobin  Lipid Panel     Component Value Date/Time   CHOL 100 11/02/2009 2159   TRIG 105 11/02/2009 2159   HDL 30 (L) 11/02/2009 2159   CHOLHDL 3.3 Ratio 11/02/2009 2159   VLDL 21 11/02/2009 2159   LDLCALC 49 11/02/2009 2159     Imaging MRI brain extensive T2 FLAIR  signal abnormality in bilateral cerebral Mesmer matter and advanced atrophy.  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   Impression: Evaluate for HIV encephalitis Evaluate for opportunistic CNS infections Evaluate for personality disturbance in the setting of HIV AIDS Evaluate for CNS inflammation markers Vitamin B12 deficiency  Recommendations: We will perform spinal tap at bedside today We will send CSF studies as recommended by ID Replete B12   -- Milon DikesAshish Yadier Bramhall, MD [facility] Pager: [phone] If 7pm to 7am, please call on call as listed on AMION.

## 2017-04-30 LAB — HERPES SIMPLEX VIRUS(HSV) DNA BY PCR
HSV 1 DNA: NEGATIVE
HSV 2 DNA: NEGATIVE

## 2017-04-30 LAB — EPSTEIN BARR VRS(EBV DNA BY PCR)
EBV DNA QN BY PCR: NEGATIVE {copies}/mL
LOG10 EBV DNA QN PCR: UNDETERMINED {Log_copies}/mL

## 2017-05-01 LAB — VDRL, CSF: VDRL Quant, CSF: NONREACTIVE

## 2017-05-02 LAB — CSF CULTURE W GRAM STAIN
Culture: NO GROWTH
Gram Stain: NONE SEEN

## 2017-05-02 LAB — CULTURE, BLOOD (ROUTINE X 2)
CULTURE: NO GROWTH
Culture: NO GROWTH
SPECIAL REQUESTS: ADEQUATE

## 2017-05-02 LAB — VITAMIN B1: Vitamin B1 (Thiamine): 78 nmol/L (ref 66.5–200.0)

## 2017-05-02 LAB — JC VIRUS, PCR CSF: JC Virus PCR, CSF: NEGATIVE

## 2017-05-03 NOTE — Discharge Summary (Signed)
Name: Carl Bradley MRN: [id number] DOB: 01-14-1990 27 y.o. PCP: Blanchard Kelchixon, Stephanie N, NP  Date of Admission: 04/27/2017  4:02 PM Date of Discharge: 04/29/2017 Attending Physician:  Dr. Sandre Kittyaines  Discharge Diagnosis: 1. Encephalopathy  Principal Problem:   Encephalopathy, unspecified Active Problems:   AIDS (acquired immune deficiency syndrome) (HCC)   Adjustment disorder with anxiety   Tobacco use   Neurocognitive disorder   Discharge Medications: Allergies as of 04/29/2017   No Known Allergies     Medication List    TAKE these medications   azithromycin 600 MG tablet Commonly known as:  ZITHROMAX Take 2 tablets (1,200 mg total) once a week by mouth.   bictegravir-emtricitabine-tenofovir AF 50-200-25 MG Tabs tablet Commonly known as:  BIKTARVY Take 1 tablet daily by mouth. Try to take at the same time each day with or without food.   clotrimazole-betamethasone cream Commonly known as:  LOTRISONE Apply to affected area 2 times daily prn   cyanocobalamin 1000 MCG tablet Take 1 tablet (1,000 mcg total) by mouth daily.   diphenoxylate-atropine 2.5-0.025 MG tablet Commonly known as:  LOMOTIL Take 1 tablet by mouth 4 (four) times daily as needed for diarrhea or loose stools.   sulfamethoxazole-trimethoprim 400-80 MG tablet Commonly known as:  BACTRIM Take 1 tablet by mouth daily.   triamcinolone lotion 0.1 % Commonly known as:  KENALOG Apply 1 application topically 3 (three) times daily.       Disposition and follow-up:   Mr.Carl Bradley [facility].  At the hospital follow up visit please address:  1.  Results of lumbar puncture.    2.  Labs / imaging needed at time of follow-up: none  3.  Pending labs/ test needing follow-up: CMV, EBV, JC virus along with cell count, glucose, protein, Gram stain, bacterial and fungal cultures and cryptococcal antigen   Follow-up Appointments:   Hospital Course by  problem list: Principal Problem:   Encephalopathy, unspecified Active Problems:   AIDS (acquired immune deficiency syndrome) (HCC)   Adjustment disorder with anxiety   Tobacco use   Neurocognitive disorder   Encephalopathy  Patient with history of HIV presenting with possible neurocognitive dysfunction despite HAART therapy. 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  Patient was admitted for further workup at the request of Infectious Disease.  During admission patient was alert and oriented, easy to engage in conversation with appropriate mood.  However, he does have poor insight into his condition and easily destractable. Neurology and psychiatry were consulted.  Neurology preformed a lumbar puncture and obtained labs assessing for CMV, EBV, JC virus along with cell count, glucose, protein, Gram stain, bacterial and fungal cultures and cryptococcal antigen were sent. These results were pending at time of discharge.  Patient was told to follow up with Infectious disease for results.  Psychiatry recommended a cognitive psychological evaluation upon discharge.  B12 deficiency Patient was noted to have a B12 level of 132.  He was started on B12 replacement and discharged with B12 supplementation.     HIV Diagnosed in 2011 progressed to AIDS, began treatment 6 months ago.Most recent CD4 count 130, HIV RNA 32,000. RPR negative as of 04/17/2017. He was continued on Biktarvy and Bactrim.   Discharge Vitals:   BP 106/62 (BP Location: Right Arm)   Pulse 68   Temp 97.8 F (36.6 C) (Oral)   Ht 6' (1.829 m)   Wt 177 lb 1.9 oz (80.3 kg)  SpO2 99%   BMI 24.02 kg/m     Discharge Instructions: Discharge Instructions    Diet - low sodium heart healthy   Complete by:  As directed    Discharge instructions   Complete by:  As directed    Please follow up with Rexene Alberts after discharge to discuss the results of your lab work   Increase activity slowly   Complete by:  As  directed       Signed: Camelia Phenes, DO 05/03/2017, 7:15 AM   Pager: [phone]

## 2017-05-09 LAB — CMV DNA, QUANTITATIVE, PCR
CMV DNA QUANT: POSITIVE [IU]/mL
LOG10 CMV QN DNA PL: UNDETERMINED {Log_IU}/mL

## 2017-05-17 ENCOUNTER — Ambulatory Visit (INDEPENDENT_AMBULATORY_CARE_PROVIDER_SITE_OTHER): Payer: Self-pay | Admitting: Infectious Diseases

## 2017-05-17 ENCOUNTER — Encounter: Payer: Self-pay | Admitting: Infectious Diseases

## 2017-05-17 VITALS — BP 131/76 | HR 72 | Temp 98.7°F | Wt 177.0 lb

## 2017-05-17 DIAGNOSIS — R419 Unspecified symptoms and signs involving cognitive functions and awareness: Secondary | ICD-10-CM

## 2017-05-17 DIAGNOSIS — E538 Deficiency of other specified B group vitamins: Secondary | ICD-10-CM | POA: Insufficient documentation

## 2017-05-17 DIAGNOSIS — Z720 Tobacco use: Secondary | ICD-10-CM

## 2017-05-17 DIAGNOSIS — B20 Human immunodeficiency virus [HIV] disease: Secondary | ICD-10-CM

## 2017-05-17 DIAGNOSIS — R197 Diarrhea, unspecified: Secondary | ICD-10-CM

## 2017-05-17 MED ORDER — CYANOCOBALAMIN 1000 MCG PO TABS: 1000.0000 ug | ORAL_TABLET | Freq: Every day | ORAL | 0 refills | Status: DC

## 2017-05-17 MED ORDER — BICTEGRAVIR-EMTRICITAB-TENOFOV 50-200-25 MG PO TABS: 1.0000 | ORAL_TABLET | Freq: Every day | ORAL | 6 refills | Status: DC

## 2017-05-17 NOTE — Assessment & Plan Note (Signed)
Side effect to Biktarvy vs HIV associated enteropathy. He has not had infectious work up for loose bowel movements at this point considering it has been intermittent episodes of loose stools that he seems to have urgency with vs lack of desire to find a restroom. I offered a switch to alternative agent as a future consideration as I do not recall this was a problem for him prior to initiating HAART [facility]?).  For now I have recommended over the counter Imodium as needed. Consider stool assessment in future visit if other symptoms develop.

## 2017-05-17 NOTE — Assessment & Plan Note (Addendum)
MRI does not demonstrate a mass/lesion effect and remarkable for advanced changes out of proportion to the patient's age. Lumbar puncture is unrevealing for opportunistic infectious or malignant cause or explanation as to his behavioral changes, memory difficulties, poor insight to disease process and general overall lack of concern with his health. He does not display bradyphrenia or bradykinesia.  He continues to require maximum effort from his mother with ADLs. Impulsive with poor judgement. 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 I explained to Atha, his grandmother and his mother today that I am more and more concerned that his neurocognitive function/memory will not improve due to the long standing damage to his CNS from untreated HIV for nearly a decade. I also explained that the only way to tell if any of this is reversible encephalitis is to continue on his HAART.   I would like for him to follow up outpatient neurology with Dr. Darrin Luis whom saw [facility] inpatient to ensure/validate my suspicion and if anything else may be warranted to help with progression or manage some of the symptoms he is experienceing.   We discussed pursuing disability as well as legal guardianship. Will refer to [facility] for case management and navigation of multiple needs.

## 2017-05-17 NOTE — Patient Instructions (Addendum)
Imodium over the counter per package instructions - no more than 4 per day for diarrhea.   Zyrtec or Claritin - for cough / sneezing.   Over the counter cough syrups are fine - Delsym generic is my favorite.   We will arrange for [name] our case manager to meet you - you can expect a call or letter from him soon. He can help you with some resources you need and how to navigate those.   Please come back in 2 months to check in with our team again and will draw labs at that time.

## 2017-05-17 NOTE — Assessment & Plan Note (Addendum)
Struggles with remembering to take his Biktarvy without mom's help. Reviewed previous lab results with Ramesses, his mother and grandmother. No evidence of thrush (typical or erythematous) today on exam. Encouraged to stay on medication to ensure best chance at halting progression of any further neurologic changes. He will return in 2 months to recheck VL and CD4 again. Continue Bactrim and Biktarvy.

## 2017-05-17 NOTE — Progress Notes (Signed)
Name: Carl Bradley  DOB: Apr 25, 1990 MRN: [id number]  PCP: Blanchard Kelch, NP   Patient Active Problem List   Diagnosis Date Noted  . Vitamin B12 deficiency 05/17/2017  . Diarrhea 05/17/2017  . Neurocognitive disorder 02/21/2017  . Tobacco use 10/24/2016  . Healthcare maintenance 10/24/2016  . AIDS (acquired immune deficiency syndrome) (HCC) 08/06/2009    SUBJECTIVE:  Brief Narrative:  Carl Bradley is a 27 y.o. AA male with HIV infection diagnosed in 2011 after being tested to donate blood in high school; HIV RNA in 10/2009 with VL 197,000 and apparently his CD4 was < 200 at that time. Defered any treatment until late 2018. CD4 nadir 30. OI's: Oral Thrush, shingles. HIV Risk: MSM.   Previous Regimen:   Biktarvy --> VL < 50  Genotype:   11/2016 - sensitive  HPI/ROS:  Carl Bradley is here today for HIV/AIDS follow up. He is with his mother and grandmother and has given consent to discuss all aspects of his medical care.   Carl Bradley tells me he is doing well and happy to see me. No concerns over his health and reports he always remembers to take his medications. He feels as if he is doing very well. His mother adds that she often has to remind him to take his medication but does give him a reasonable opportunity to try to remember on his own until it is a few hours after it is due. She estimates that he did miss at least 2 doses on Saturday and Sunday recently when he stayed with his father. Carl Bradley acknowledges that she is correct in saying this. His mother reports he still has some diarrhea/loose stools every few days that does prompt some urgency/incontinence at times; reports she has heard other neighbors remark he will use the bathroom on the street but she has not herself seen him do this. Carl Bradley has no comment as to this statement.   His mother would like to discuss pursuing legal guardianship and apply for Medicaid/Disability as she is fearful that these changes are not going to improve  and she needs help with providing medical care and other basic essentials to him. She tells me that he is stealing (herself, his father) and sneaks out at night knocking on neighbors doors asking for money and cigarettes. Carl Bradley again is quiet and only affirms that the information she provides is correct and offers no explanation or concern that his behavior is wrong.   Review of Systems  Constitutional: Negative for chills, fever, malaise/fatigue and weight loss.  HENT: Negative for sore throat.        No dental problems  Eyes: Negative for blurred vision.  Respiratory: Negative for cough, sputum production and shortness of breath.   Cardiovascular: Negative for chest pain, orthopnea and leg swelling.  Gastrointestinal: Negative for abdominal pain, blood in stool, diarrhea and vomiting.  Genitourinary: Negative for dysuria and flank pain.  Musculoskeletal: Negative for joint pain, myalgias and neck pain.  Skin: Negative for itching and rash.  Neurological: Negative for dizziness, tingling, focal weakness, seizures, loss of consciousness, weakness and headaches.  Psychiatric/Behavioral: Positive for memory loss. Negative for depression, hallucinations and substance abuse. The patient is not nervous/anxious and does not have insomnia.    Past Medical History:  Diagnosis Date  . HIV disease (HCC)   . Tobacco use 10/24/2016   Outpatient Medications Prior to Visit  Medication Sig Dispense Refill  . clotrimazole-betamethasone (LOTRISONE) cream Apply to affected area 2 times daily prn  15 g 0  . diphenoxylate-atropine (LOMOTIL) 2.5-0.025 MG tablet Take 1 tablet by mouth 4 (four) times daily as needed for diarrhea or loose stools. 30 tablet 2  . sulfamethoxazole-trimethoprim (BACTRIM) 400-80 MG tablet Take 1 tablet by mouth daily. 30 tablet 6  . triamcinolone lotion (KENALOG) 0.1 % Apply 1 application topically 3 (three) times daily. 60 mL 0  . azithromycin (ZITHROMAX) 600 MG tablet Take 2 tablets  (1,200 mg total) once a week by mouth. 8 tablet 6  . bictegravir-emtricitabine-tenofovir AF (BIKTARVY) 50-200-25 MG TABS tablet Take 1 tablet daily by mouth. Try to take at the same time each day with or without food. 30 tablet 6  . vitamin B-12 1000 MCG tablet Take 1 tablet (1,000 mcg total) by mouth daily. 30 tablet 0   No facility-administered medications prior to visit.    No Known Allergies  Social History   Tobacco Use  . Smoking status: Current Every Day Smoker    Packs/day: 0.75    Years: 10.00    Pack years: 7.50    Types: Cigarettes  . Smokeless tobacco: Never Used  Substance Use Topics  . Alcohol use: Yes    Comment: 04/27/2017 "a drink q 2-3 months"  . Drug use: Yes    Types: Marijuana    Comment: 04/27/2017 "nothing in a couple months"    Family History  Problem Relation Age of Onset  . Healthy Mother   . Healthy Father    OBJECTIVE: Vitals:   05/17/17 1356  BP: 131/76  Pulse: 72  Temp: 98.7 F (37.1 C)     Physical Exam  Constitutional: He is oriented to person, place, and time. Vital signs are normal.  Comfortable seated in chair.    HENT:  Mouth/Throat: Oropharynx is clear and moist.  Eyes: Pupils are equal, round, and reactive to light. No scleral icterus.  Cardiovascular: Normal rate, regular rhythm and normal heart sounds.  Pulmonary/Chest: Effort normal and breath sounds normal. No respiratory distress.  Abdominal: Soft. Bowel sounds are normal. He exhibits no distension. There is no tenderness.  Musculoskeletal: Normal range of motion. He exhibits no edema.  Lymphadenopathy:    He has no cervical adenopathy.  Neurological: He is alert and oriented to person, place, and time. No cranial nerve deficit. Gait normal. Coordination normal. GCS score is 15.  Skin: Skin is warm and dry. No rash noted. He is not diaphoretic.  Psychiatric: Mood normal. His mood appears not anxious. His affect is labile. He is not agitated. He expresses impulsivity. He  exhibits abnormal recent memory.  Today he is friendly and open in discussion with me, however is unaware his behaviors are wrong or the impact on others.     Lab Results Lab Results  Component Value Date   WBC 3.6 (L) 04/29/2017   HGB 15.2 04/29/2017   HCT 41.6 04/29/2017   MCV 84.6 04/29/2017   PLT 209 04/29/2017    Lab Results  Component Value Date   CREATININE 1.18 04/29/2017   BUN 5 (L) 04/29/2017   NA 135 04/29/2017   K 4.0 04/29/2017   CL 100 (L) 04/29/2017   CO2 27 04/29/2017    Lab Results  Component Value Date   ALT 16 (L) 04/27/2017   AST 24 04/27/2017   ALKPHOS 94 04/27/2017   BILITOT 0.7 04/27/2017    HIV 1 RNA Quant (copies/mL)  Date Value  04/17/2017 32 (H)  02/14/2017 146 (H)  12/06/2016 236 (H)   CD4 T Cell  Abs (/uL)  Date Value  04/17/2017 130 (L)  02/14/2017 130 (L)  12/06/2016 70 (L)   Assessment and Plan:  Problem List Items Addressed This Visit      Nervous and Auditory   Neurocognitive disorder - Primary    MRI does not demonstrate a mass/lesion effect and remarkable for advanced changes out of proportion to the patient's age. Lumbar puncture is unrevealing for opportunistic infectious or malignant cause or explanation as to his behavioral changes, memory difficulties, poor insight to disease process and general overall lack of concern with his health. He does not display bradyphrenia or bradykinesia.  He continues to require maximum effort from his mother with ADLs. Impulsive with poor judgement. 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 I explained to Carl Bradley, his grandmother and his mother today that I am more and more concerned that his neurocognitive function/memory will not improve due to the long standing damage to his CNS from untreated HIV for nearly a decade. I also explained that the only way to tell  if any of this is reversible encephalitis is to continue on his HAART.   I would like for him to follow up outpatient neurology with Dr. Darrin Luis whom saw [facility] inpatient to ensure/validate my suspicion and if anything else may be warranted to help with progression or manage some of the symptoms he is experienceing.   We discussed pursuing disability as well as legal guardianship. Will refer to [facility] for case management and navigation of multiple needs.       Relevant Orders   Ambulatory referral to Neurology     Other   AIDS (acquired immune deficiency syndrome) (HCC)    Struggles with remembering to take his Biktarvy without mom's help. Reviewed previous lab results with Carl Bradley, his mother and grandmother. No evidence of thrush (typical or erythematous) today on exam. Encouraged to stay on medication to ensure best chance at halting progression of any further neurologic changes. He will return in 2 months to recheck VL and CD4 again. Continue Bactrim and Biktarvy.        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 He has not had infectious work up for loose bowel movements at this point considering it has been intermittent episodes of loose stools that he seems to have urgency with vs lack of desire to find a restroom. I offered a switch to alternative agent as a future consideration as I do not recall this was a problem for him prior to initiating HAART [facility]?).  For now I have recommended over the counter Imodium as needed. Consider stool assessment in future visit if other symptoms develop.       Tobacco use    Encouraged to quit smoking. He will start using Nicotine patch this weekend to help. Encouraged to not smoke with patch. Offered discussion about Wellbutrin as well if he needs some more assistance  with quit trial.       Vitamin B12 deficiency    Refill vitamin B12. Will recheck level at next lab draw.         Carl Alberts, MSN, NP-C [facility]  05/18/17 11:43 AM

## 2017-05-17 NOTE — Assessment & Plan Note (Signed)
Refill vitamin B12. Will recheck level at next lab draw.

## 2017-05-17 NOTE — Assessment & Plan Note (Signed)
Encouraged to quit smoking. He will start using Nicotine patch this weekend to help. Encouraged to not smoke with patch. Offered discussion about Wellbutrin as well if he needs some more assistance with quit trial.

## 2017-05-20 LAB — CULTURE, FUNGUS WITHOUT SMEAR

## 2017-07-30 ENCOUNTER — Encounter: Payer: Self-pay | Admitting: Infectious Diseases

## 2017-07-30 ENCOUNTER — Ambulatory Visit (INDEPENDENT_AMBULATORY_CARE_PROVIDER_SITE_OTHER): Payer: Self-pay | Admitting: Licensed Clinical Social Worker

## 2017-07-30 ENCOUNTER — Ambulatory Visit (INDEPENDENT_AMBULATORY_CARE_PROVIDER_SITE_OTHER): Payer: Self-pay | Admitting: Infectious Diseases

## 2017-07-30 VITALS — BP 125/80 | HR 62 | Temp 98.1°F | Wt 172.0 lb

## 2017-07-30 DIAGNOSIS — F4324 Adjustment disorder with disturbance of conduct: Secondary | ICD-10-CM

## 2017-07-30 DIAGNOSIS — Z792 Long term (current) use of antibiotics: Secondary | ICD-10-CM

## 2017-07-30 DIAGNOSIS — R419 Unspecified symptoms and signs involving cognitive functions and awareness: Secondary | ICD-10-CM

## 2017-07-30 DIAGNOSIS — E538 Deficiency of other specified B group vitamins: Secondary | ICD-10-CM

## 2017-07-30 DIAGNOSIS — B20 Human immunodeficiency virus [HIV] disease: Secondary | ICD-10-CM

## 2017-07-30 DIAGNOSIS — Z0000 Encounter for general adult medical examination without abnormal findings: Secondary | ICD-10-CM

## 2017-07-30 MED ORDER — SULFAMETHOXAZOLE-TRIMETHOPRIM 400-80 MG PO TABS: 1.0000 | ORAL_TABLET | Freq: Every day | ORAL | 3 refills | Status: DC

## 2017-07-30 NOTE — Assessment & Plan Note (Signed)
Has not had further follow up with Neurology since discharge. In looking at referral que there has been several attempts on [facility] behalf to contact patient and referral had been closed. I had Carl Bradley with [facility] meet with them today (previously he had been unavailable to meet with them and he reports that he has had no call back after previous attempt to contact.)  I do feel based on my assessment he needs a legal guardian due to his short term memory impact, inability to determine reasonable cause/effect relationship that may jeopardize his safety and poor insight in condition.

## 2017-07-30 NOTE — Patient Instructions (Addendum)
Nice to see you both today.   Please continue taking your Biktarvy once a day every day at the same time.   Tips for Successful Daily Medication Habits: 1. Set a reminder on your phone  2. Try filling out a pill box for the week - pick a day and put one pill for every day during the week so you know right away if you missed a pill.  3. Have a trusted family member ask you about your medications.  4. Smartphone app   We had you meet Ladona Ridgelaylor with [facility] today to help get you enrolled with Case Management to help with Shaarav's needs.   I do think that counseling sessions may be of benefit for you   I would like to see you back again in 2 months to check in.

## 2017-07-30 NOTE — Assessment & Plan Note (Signed)
Check level today and refill Rx.

## 2017-07-30 NOTE — Progress Notes (Signed)
Name: Carl Bradley  DOB: 1990-08-13 MRN: [id number]  PCP: Blanchard Kelch, NP   Patient Active Problem List   Diagnosis Date Noted  . Vitamin B12 deficiency 05/17/2017  . Diarrhea 05/17/2017  . Neurocognitive disorder 02/21/2017  . Tobacco use 10/24/2016  . Healthcare maintenance 10/24/2016  . AIDS (acquired immune deficiency syndrome) (HCC) 08/06/2009    BRIEF NARRATIVE:  Carl Bradley is a 27 y.o. AA male with HIV infection diagnosed in 2011 after being tested to donate blood in high school; HIV RNA in 10/2009 with VL 197,000 and apparently his CD4 was < 200 at that time. Defered any treatment until late 2018. CD4 nadir 30. OI's: Oral Thrush, shingles. HIV Risk: MSM.   Previous Regimen:   Biktarvy --> VL < 50  Genotype:   11/2016 - sensitive   SUBJECTIVE:  HPI/ROS:  Carl Bradley is here today for HIV/AIDS follow up. He is with his mother and has given consent to discuss all aspects of his medical care.   Carl Bradley tells me he is doing well today and has no complaints or concerns to discuss today over his health in general. No concerns over his health and reports he always remembers to take his medications and feels as if he is doing very well. His mother adds that she has been working more and now "burden of reminders" fall on his grandmother whom also lives with them. She tells me that he still has behaviors of self-neglect, inattention and "lack of making connections with actions." She tells me he is still going out late at night "bothering neighbors and knocking on screen doors looking for cigarettes; often look for cigarettes in the street." Carl Bradley denies all of this and feels that his mother and grandmother "are being really petty." Diarrhea has been better - intermittent but still has stools in the shower and leaves them without notice.   His mother would like to again discuss pursuing legal guardianship and apply for Medicaid/Disability - has not heard from Case Manager I spoke of  last visit.   Review of Systems  Constitutional: Negative for chills, fever, malaise/fatigue and weight loss.  HENT: Negative for sore throat.        No dental problems  Eyes: Negative for blurred vision.  Respiratory: Negative for cough, sputum production and shortness of breath.   Cardiovascular: Negative for chest pain, orthopnea and leg swelling.  Gastrointestinal: Negative for abdominal pain, blood in stool, diarrhea and vomiting.  Genitourinary: Negative for dysuria and flank pain.  Musculoskeletal: Negative for joint pain, myalgias and neck pain.  Skin: Negative for itching and rash.  Neurological: Negative for dizziness, tingling, focal weakness, seizures, loss of consciousness, weakness and headaches.  Psychiatric/Behavioral: Positive for memory loss. Negative for depression, hallucinations and substance abuse. The patient is not nervous/anxious and does not have insomnia.    Past Medical History:  Diagnosis Date  . HIV disease (HCC)   . Tobacco use 10/24/2016   Outpatient Medications Prior to Visit  Medication Sig Dispense Refill  . bictegravir-emtricitabine-tenofovir AF (BIKTARVY) 50-200-25 MG TABS tablet Take 1 tablet by mouth daily. Try to take at the same time each day with or without food. 30 tablet 6  . clotrimazole-betamethasone (LOTRISONE) cream Apply to affected area 2 times daily prn 15 g 0  . cyanocobalamin 1000 MCG tablet Take 1 tablet (1,000 mcg total) by mouth daily. 30 tablet 0  . diphenoxylate-atropine (LOMOTIL) 2.5-0.025 MG tablet Take 1 tablet by mouth 4 (four) times daily as  needed for diarrhea or loose stools. 30 tablet 2  . sulfamethoxazole-trimethoprim (BACTRIM) 400-80 MG tablet Take 1 tablet by mouth daily. 30 tablet 6  . triamcinolone lotion (KENALOG) 0.1 % Apply 1 application topically 3 (three) times daily. 60 mL 0   No facility-administered medications prior to visit.    No Known Allergies  Social History   Tobacco Use  . Smoking status:  Current Every Day Smoker    Packs/day: 0.75    Years: 10.00    Pack years: 7.50    Types: Cigarettes  . Smokeless tobacco: Never Used  Substance Use Topics  . Alcohol use: Yes    Comment: 04/27/2017 "a drink q 2-3 months"  . Drug use: Yes    Types: Marijuana    Comment: 04/27/2017 "nothing in a couple months"    Family History  Problem Relation Age of Onset  . Healthy Mother   . Healthy Father    OBJECTIVE: Vitals:   07/30/17 1000  BP: 125/80  Pulse: 62  Temp: 98.1 F (36.7 C)     Physical Exam  Constitutional: He is oriented to person, place, and time. Vital signs are normal.  Young male seated comfortably in chair. Dressed with [employer]; generally clean.  HENT:  Mouth/Throat: Oropharynx is clear and moist.  Eyes: Pupils are equal, round, and reactive to light. No scleral icterus.  Cardiovascular: Normal rate, regular rhythm and normal heart sounds.  Pulmonary/Chest: Effort normal and breath sounds normal. No respiratory distress.  Abdominal: Soft. Bowel sounds are normal. He exhibits no distension. There is no tenderness.  Musculoskeletal: Normal range of motion. He exhibits no edema.  Lymphadenopathy:    He has no cervical adenopathy.  Neurological: He is alert and oriented to person, place, and time. No cranial nerve deficit. Gait normal. Coordination normal. GCS score is 15.  Skin: Skin is warm and dry. No rash noted. He is not diaphoretic.  Psychiatric: Mood normal. His mood appears not anxious. His affect is labile. He is not agitated. He expresses impulsivity. He exhibits abnormal recent memory.  Today he is friendly and open in discussion with me, however is unaware his behaviors are wrong or the impact on others.     Lab Results Lab Results  Component Value Date   WBC 3.6 (L) 04/29/2017   HGB 15.2 04/29/2017   HCT 41.6 04/29/2017   MCV 84.6 04/29/2017   PLT 209 04/29/2017    Lab Results  Component Value Date   CREATININE 1.18 04/29/2017    BUN 5 (L) 04/29/2017   NA 135 04/29/2017   K 4.0 04/29/2017   CL 100 (L) 04/29/2017   CO2 27 04/29/2017    Lab Results  Component Value Date   ALT 16 (L) 04/27/2017   AST 24 04/27/2017   ALKPHOS 94 04/27/2017   BILITOT 0.7 04/27/2017    HIV 1 RNA Quant (copies/mL)  Date Value  04/17/2017 32 (H)  02/14/2017 146 (H)  12/06/2016 236 (H)   CD4 T Cell Abs (/uL)  Date Value  04/17/2017 130 (L)  02/14/2017 130 (L)  12/06/2016 70 (L)   Assessment and Plan:  Problem List Items Addressed This Visit      Nervous and Auditory   Neurocognitive disorder    Has not had further follow up with Neurology since discharge. In looking at referral que there has been several attempts on [facility] behalf to contact patient and referral had been closed. I had Ladona Ridgel with [facility] meet with them today (  previously he had been unavailable to meet with them and he reports that he has had no call back after previous attempt to contact.)  I do feel based on my assessment he needs a legal guardian due to his short term memory impact, inability to determine reasonable cause/effect relationship that may jeopardize his safety and poor insight in condition.       Relevant Orders   B12 (Completed)     Other   Vitamin B12 deficiency    Check level today and refill Rx.       Healthcare maintenance    Hep A #2 due in October.  Prevnar 04-2018.       AIDS (acquired immune deficiency syndrome) (HCC) - Primary    Reviewed previous lab values related to HIV care with [name] and his mother. I worry greatly that now that his mother is working more to support him he has had poor compliance on his regimen. Will assess viral load today and CD4. Continue Biktarvy for now. He will continue bactrim once a day until CD4 > 200 x 3639m. He is not currently sexually active from what he reports.  I also had him and his mother meet with [name] today for some assistance as there appears to be a lot of friction amongst them at home. I  provided support and appreciation to Adama's mother today.  He will return in 2 months for follow up care and ensure resources are falling into place. I do fear at some point he will require group home.       Relevant Orders   HIV 1 RNA quant-no reflex-bld   T-helper cell (CD4)- ([facility] clinic only)   RPR     Rexene AlbertsStephanie Britt Petroni, MSN, NP-C [facility]  07/30/17 10:01 PM

## 2017-07-30 NOTE — Assessment & Plan Note (Addendum)
Reviewed previous lab values related to HIV care with Carl Bradley and his mother. I worry greatly that now that his mother is working more to support him he has had poor compliance on his regimen. Will assess viral load today and CD4. Continue Biktarvy for now. He will continue bactrim once a day until CD4 > 200 x 7363m. He is not currently sexually active from what he reports.  I also had him and his mother meet with [name] today for some assistance as there appears to be a lot of friction amongst them at home. I provided support and appreciation to [name] today.  He will return in 2 months for follow up care and ensure resources are falling into place. I do fear at some point he will require group home.

## 2017-07-30 NOTE — Assessment & Plan Note (Signed)
Hep A #2 due in October.  Prevnar 04-2018.

## 2017-07-31 LAB — T-HELPER CELL (CD4) - (RCID CLINIC ONLY)
CD4 % Helper T Cell: 7 % — ABNORMAL LOW (ref 33–55)
CD4 T CELL ABS: 70 /uL — AB (ref 400–2700)

## 2017-07-31 LAB — RPR: RPR Ser Ql: NONREACTIVE

## 2017-07-31 LAB — VITAMIN B12: Vitamin B-12: 459 pg/mL (ref 200–1100)

## 2017-07-31 NOTE — BH Specialist Note (Signed)
Integrated Behavioral Health Follow Up Visit  MRN: [id number] Name: Carl Bradley  Number of Integrated Behavioral Health Clinician visits: 2/6 Session Start time: 10:33am  Session End time: 11:00am Total time: 30 minutes  Type of Service: Integrated Behavioral Health- Individual/Family Interpretor:No. Interpretor Name and Language: n/a  SUBJECTIVE: Carl Bradley is a 27 y.o. male accompanied by Mother Patient reports the following symptoms/concerns: continues to use the bathroom on himself or in the yard, makes age-inapproriate decisions, inability to self-monitor needs, difficulty in family relationships   OBJECTIVE: Mood: Depressed and Affect: Labile Risk of harm to self or others: No plan to harm self or others  LIFE CONTEXT: Patient and mom report that he continues to have problems with defecating in the shower or other inappropriate places. Patient states that he has become immune to the smell and does not realize what has happened. Mom states that she struggles with patient "sneaking out" of the house to go to the tobacco store down the street, especially when she is at work and her mother is home with him. Patient states that he wakes grandmother and tells her where he is going before he leaves, so it is not sneaking out, but that she must fall back to sleep and forget that he told her. Mother reports significant stress in dealing with these behaviors, and that she has heard stories about his behaviors from others. She also experesses concerns about where patient gets the money to buy the things he does. Patient and mom are argumentative with one another, with mom emphasizing that she is so watchful over him because even though he is an adult he does not seem to have the ability to think through things and make responsible decisions that are consistent with his chronological age. Patient acknowledges that he does not make healthy decisions, but states that he does not want to accept  that he cannot.   GOALS ADDRESSED: Patient will: 1.  Increase knowledge and/or ability of: self-management skills    INTERVENTIONS: Interventions utilized:  Motivational Interviewing, Solution-Focused Strategies and Supportive Counseling  ASSESSMENT: Patient currently experiencing encopresis, lack of insight, poor judgement, difficulty focusing and making decisions, inability to recognize personal needs. Counselor explored with patient and mom the decisions he has made that have either put him in danger or that have made things difficult for him and his family members. Patient is able to say that he has made some bad decisions, but struggles to identify what they were without mom's help. He states that he knows he has some impairments in terms of decision making, but does not want to accept that or that he may need to listen to others about what is best for him. Counselor explored with patient the consequences and results of some of the behaviors mom has identified as troublesome. Counselor facilitated functional analysis of behaviors to help patient recognize decision points where something different could have been done. Patient and counselor explored acceptance versus denial of the situation. Counselor emphasized that not accepting the way things are will not change the fact that they are. Patient and counselor explored the idea of creating tools to help patient recognize his need to make responsible decisions, and identify what those decisions are.   Patient may benefit from ongoing Reality Therapy  And Motivational Interviewing.  PLAN: 1. Follow up with behavioral health clinician on : 08/08/17 @ 9:30am  Angus Palmsegina Ulyssa Walthour, LCSW

## 2017-08-01 LAB — HIV-1 RNA QUANT-NO REFLEX-BLD
HIV 1 RNA Quant: 69 copies/mL — ABNORMAL HIGH
HIV-1 RNA Quant, Log: 1.84 Log copies/mL — ABNORMAL HIGH

## 2017-08-02 ENCOUNTER — Telehealth: Payer: Self-pay | Admitting: Infectious Diseases

## 2017-08-02 DIAGNOSIS — R419 Unspecified symptoms and signs involving cognitive functions and awareness: Secondary | ICD-10-CM

## 2017-08-02 NOTE — Telephone Encounter (Signed)
Per message from Rexene AlbertsStephanie Dixon NP called the patient mother to advise the FMLA paperwork she dropped of is complete and ready for pick up. Faxed a copy of the information to her company  [employer]  Attn Kathy Breacherresia Harrison Fax [phone]   Also advised her will be getting a call from a Neurologist as she wants him to have further evaluation of function. And to answer her phone when they call.

## 2017-08-02 NOTE — Telephone Encounter (Signed)
Intermittent FMLA forms completed  Please also call Carl Bradley's mom, Carl Bradley to inform her to expect a call from neuropsychology team to go forward with more formal testing so we can gather more information to support getting them resources.   Thank you,  Steph

## 2017-08-08 ENCOUNTER — Ambulatory Visit: Payer: Self-pay | Admitting: Licensed Clinical Social Worker

## 2017-08-28 ENCOUNTER — Encounter: Payer: Self-pay | Admitting: Infectious Diseases

## 2017-11-27 ENCOUNTER — Encounter: Payer: Self-pay | Admitting: Infectious Diseases

## 2017-11-27 ENCOUNTER — Ambulatory Visit (INDEPENDENT_AMBULATORY_CARE_PROVIDER_SITE_OTHER): Payer: Self-pay | Admitting: Infectious Diseases

## 2017-11-27 VITALS — BP 144/82 | HR 89 | Temp 98.9°F | Wt 167.0 lb

## 2017-11-27 DIAGNOSIS — Z792 Long term (current) use of antibiotics: Secondary | ICD-10-CM

## 2017-11-27 DIAGNOSIS — R419 Unspecified symptoms and signs involving cognitive functions and awareness: Secondary | ICD-10-CM

## 2017-11-27 DIAGNOSIS — Z720 Tobacco use: Secondary | ICD-10-CM

## 2017-11-27 DIAGNOSIS — Z0000 Encounter for general adult medical examination without abnormal findings: Secondary | ICD-10-CM

## 2017-11-27 DIAGNOSIS — B20 Human immunodeficiency virus [HIV] disease: Secondary | ICD-10-CM

## 2017-11-27 DIAGNOSIS — Z23 Encounter for immunization: Secondary | ICD-10-CM

## 2017-11-27 MED ORDER — BICTEGRAVIR-EMTRICITAB-TENOFOV 50-200-25 MG PO TABS: 1.0000 | ORAL_TABLET | Freq: Every day | ORAL | 11 refills | Status: DC

## 2017-11-27 MED ORDER — SULFAMETHOXAZOLE-TRIMETHOPRIM 400-80 MG PO TABS: 1.0000 | ORAL_TABLET | Freq: Every day | ORAL | 11 refills | Status: DC

## 2017-11-27 NOTE — Progress Notes (Signed)
Name: Carl Bradley  DOB: 07-23-1990 MRN: [id number]  PCP: McClelland Callas, NP   Patient Active Problem List   Diagnosis Date Noted  . Vitamin B12 deficiency 05/17/2017  . Diarrhea 05/17/2017  . Neurocognitive disorder 02/21/2017  . Tobacco use 10/24/2016  . Healthcare maintenance 10/24/2016  . AIDS (acquired immune deficiency syndrome) ([place]) 08/06/2009   SUBJECTIVE:  Brief Narrative: Carl Bradley is a 27 y.o. AA male with HIV infection diagnosed in 2011 after being tested to donate blood in high school; HIV RNA in 10/2009 with VL 197,000 and apparently his CD4 was < 200 at that time. Defered any treatment until late 2018. CD4 nadir 30. OI's: Oral Thrush, shingles. HIV Risk: MSM.   Previous Regimen:   Biktarvy --> VL < 50  Genotype:   11/2016 - sensitive    CC:  Here for HIV/AIDS follow up. He is with his mother and has given consent to discuss all aspects of his medical care.    HPI/ROS:  Doing well per Carl Bradley's account. He takes his medications most days but estimates he forgets 1-2 doses a week. He does report that he "doubles up" sometimes to make up for doses. He is tolerating the medication well and has no side effects. Diarrhea is still intermittent. He mostly stays at home with his grandmother and mother watching TV. Strong desire to smoke cigarettes and frequently will leave house still looking for cigarette butts on the street. He is not sexually active presently in any regard. Has not had flu shot this year yet.   His mother adds that she has not seen any changes in his neurocognitive status/function and everything is about the same. Very impulsive and smokes a lot and eats a lot of sugar.   They are now working with Karn Pickler to pursue legal guardianship and apply for Medicaid/Disability. They are thankful for this.   Review of Systems  Constitutional: Negative for chills, fever, malaise/fatigue and weight loss.  HENT: Negative for sore throat.        No dental  problems  Eyes: Negative for blurred vision.  Respiratory: Negative for cough, sputum production and shortness of breath.   Cardiovascular: Negative for chest pain, orthopnea and leg swelling.  Gastrointestinal: Negative for abdominal pain, blood in stool, diarrhea and vomiting.  Genitourinary: Negative for dysuria and flank pain.  Musculoskeletal: Negative for joint pain, myalgias and neck pain.  Skin: Negative for itching and rash.  Neurological: Negative for dizziness, tingling, focal weakness, seizures, loss of consciousness, weakness and headaches.  Psychiatric/Behavioral: Positive for memory loss. Negative for depression, hallucinations and substance abuse. The patient is not nervous/anxious and does not have insomnia.    Past Medical History:  Diagnosis Date  . HIV disease ([place])   . Tobacco use 10/24/2016   Outpatient Medications Prior to Visit  Medication Sig Dispense Refill  . clotrimazole-betamethasone (LOTRISONE) cream Apply to affected area 2 times daily prn 15 g 0  . cyanocobalamin 1000 MCG tablet Take 1 tablet (1,000 mcg total) by mouth daily. 30 tablet 0  . diphenoxylate-atropine (LOMOTIL) 2.5-0.025 MG tablet Take 1 tablet by mouth 4 (four) times daily as needed for diarrhea or loose stools. 30 tablet 2  . triamcinolone lotion (KENALOG) 0.1 % Apply 1 application topically 3 (three) times daily. 60 mL 0  . bictegravir-emtricitabine-tenofovir AF (BIKTARVY) 50-200-25 MG TABS tablet Take 1 tablet by mouth daily. Try to take at the same time each day with or without food. 30 tablet 6  .  sulfamethoxazole-trimethoprim (BACTRIM) 400-80 MG tablet Take 1 tablet by mouth daily. 30 tablet 3   No facility-administered medications prior to visit.    No Known Allergies  Social History   Tobacco Use  . Smoking status: Current Every Day Smoker    Packs/day: 0.75    Years: 10.00    Pack years: 7.50    Types: Cigarettes  . Smokeless tobacco: Never Used  Substance Use Topics  .  Alcohol use: Yes    Comment: 04/27/2017 "a drink q 2-3 months"  . Drug use: Yes    Types: Marijuana    Comment: 04/27/2017 "nothing in a couple months"    OBJECTIVE: Vitals:   11/27/17 1447  BP: (!) 144/82  Pulse: 89  Temp: 98.9 F (37.2 C)     Physical Exam  Constitutional: He is oriented to person, place, and time. Vital signs are normal.  Young male seated comfortably in chair. T-shirt and sweat pants. Generally clean.  HENT:  Mouth/Throat: Oropharynx is clear and moist.  Eyes: Pupils are equal, round, and reactive to light. No scleral icterus.  Cardiovascular: Normal rate, regular rhythm and normal heart sounds.  Pulmonary/Chest: Effort normal and breath sounds normal. No respiratory distress.  Abdominal: Soft. Bowel sounds are normal. He exhibits no distension. There is no tenderness.  Musculoskeletal: Normal range of motion. He exhibits no edema.  Lymphadenopathy:    He has no cervical adenopathy.  Neurological: He is alert and oriented to person, place, and time. No cranial nerve deficit. Gait normal. Coordination normal. GCS score is 15.  Skin: Skin is warm and dry. No rash noted. He is not diaphoretic.  Psychiatric: Mood normal. His mood appears not anxious. His affect is labile. He is not agitated. He expresses impulsivity. He exhibits abnormal recent memory.  Today he is friendly and open in discussion with me. Makes good eye contact. Echos a lot during conversation and interjects inappropriately.     Lab Results Lab Results  Component Value Date   WBC 3.6 (L) 04/29/2017   HGB 15.2 04/29/2017   HCT 41.6 04/29/2017   MCV 84.6 04/29/2017   PLT 209 04/29/2017    Lab Results  Component Value Date   CREATININE 1.18 04/29/2017   BUN 5 (L) 04/29/2017   NA 135 04/29/2017   K 4.0 04/29/2017   CL 100 (L) 04/29/2017   CO2 27 04/29/2017    Lab Results  Component Value Date   ALT 16 (L) 04/27/2017   AST 24 04/27/2017   ALKPHOS 94 04/27/2017   BILITOT 0.7  04/27/2017    HIV 1 RNA Quant (copies/mL)  Date Value  07/30/2017 69 (H)  04/17/2017 32 (H)  02/14/2017 146 (H)   CD4 T Cell Abs (/uL)  Date Value  07/30/2017 70 (L)  04/17/2017 130 (L)  02/14/2017 130 (L)   Assessment and Plan:  Problem List Items Addressed This Visit      Unprioritized   AIDS (acquired immune deficiency syndrome) ([place]) - Primary    Will check labs for him today to assess if he can stop bactrim. Will refill biktarvy and bactrim today. Reminded of HMAP re-enrollment in January and need to make an appointment. He will have labs done today and come back in 3 months for follow up.  Dental referral placed today for [facility].        Relevant Medications   sulfamethoxazole-trimethoprim (BACTRIM) 400-80 MG tablet   bictegravir-emtricitabine-tenofovir AF (BIKTARVY) 50-200-25 MG TABS tablet   Other Relevant Orders  HIV-1 RNA quant-no reflex-bld   T-helper cell (CD4)- ([facility] clinic only)   RPR   Urinalysis   Urine cytology ancillary only   T-helper cell (CD4)- ([facility] clinic only)   HIV-1 RNA quant-no reflex-bld   CBC   Comprehensive metabolic panel   Lipid panel   Comp Met ([facility])   Healthcare maintenance    Flu and Menveo #1 today. Will give Menveo #2 at upcoming appt. Prevnar due 04/2018.      Relevant Orders   Hepatitis A Ab, Total   Hemoglobin A1c   Neurocognitive disorder    More impulsive today and unable to make decisions for himself without strong guidance from parents/guardians. Working with Karn Pickler at [facility] to arrange for guardianship and other resources Kenyetta and his family needs.       Tobacco use    Counseled to quit as it increases his risk for CAD, MI, CVA. Will try nicotine patch.        Other Visit Diagnoses    Prophylactic antibiotic       Relevant Medications   sulfamethoxazole-trimethoprim (BACTRIM) 400-80 MG tablet      Janene Madeira, MSN, NP-C [facility]  11/27/17 3:48 PM

## 2017-11-27 NOTE — Assessment & Plan Note (Addendum)
Will check labs for him today to assess if he can stop bactrim. Will refill biktarvy and bactrim today. Reminded of HMAP re-enrollment in January and need to make an appointment. He will have labs done today and come back in 3 months for follow up.  Dental referral placed today for [facility].

## 2017-11-27 NOTE — Assessment & Plan Note (Signed)
Counseled to quit as it increases his risk for CAD, MI, CVA. Will try nicotine patch.

## 2017-11-27 NOTE — Patient Instructions (Addendum)
Nice to see you both.   Will have you continue your biktarvy and bactrim (the antibiotic) once a day. Very important to get this pill in once a day and to not miss any doses. NEVER double up your pills. Continue your Bactrim once a day until we make sure your CD4 is higher.   Will have you back in 3 months - please come 2 weeks before your visit for fasting labs (nothing to eat and only water to drink 8 hours before the test).   Please call Claris CheMargaret with [facility] @ [phone] to schedule a dental appointment.   Please schedule an appointment with Olegario MessierKathy in January to re-enroll in your ADAP for HIV insurance.     Please sign up with MyChart to access your labs and set up email communication with our clinic for non-urgent medical concerns.

## 2017-11-27 NOTE — Assessment & Plan Note (Signed)
Flu and Menveo #1 today. Will give Menveo #2 at upcoming appt. Prevnar due 04/2018.

## 2017-11-27 NOTE — Assessment & Plan Note (Signed)
More impulsive today and unable to make decisions for himself without strong guidance from parents/guardians. Working with Marthann [facility]Mitch at [facility] to arrange for guardianship and other resources Carl Bradley and his family needs.

## 2017-11-28 LAB — URINE CYTOLOGY ANCILLARY ONLY
Chlamydia: NEGATIVE
Neisseria Gonorrhea: NEGATIVE

## 2017-11-28 LAB — T-HELPER CELL (CD4) - (RCID CLINIC ONLY)
CD4 % Helper T Cell: 12 % — ABNORMAL LOW (ref 33–55)
CD4 T CELL ABS: 90 /uL — AB (ref 400–2700)

## 2017-11-28 NOTE — Addendum Note (Signed)
Addended by: Lurlean LeydenPOOLE, TRAVIS F on: 11/28/2017 10:16 AM   Modules accepted: Orders

## 2017-11-29 LAB — COMPREHENSIVE METABOLIC PANEL
AG Ratio: 1.6 (calc) (ref 1.0–2.5)
ALKALINE PHOSPHATASE (APISO): 114 U/L (ref 40–115)
ALT: 15 U/L (ref 9–46)
AST: 16 U/L (ref 10–40)
Albumin: 4.5 g/dL (ref 3.6–5.1)
BUN: 9 mg/dL (ref 7–25)
CHLORIDE: 101 mmol/L (ref 98–110)
CO2: 26 mmol/L (ref 20–32)
CREATININE: 1.11 mg/dL (ref 0.60–1.35)
Calcium: 9.7 mg/dL (ref 8.6–10.3)
GLOBULIN: 2.9 g/dL (ref 1.9–3.7)
Glucose, Bld: 82 mg/dL (ref 65–99)
Potassium: 4.2 mmol/L (ref 3.5–5.3)
Sodium: 138 mmol/L (ref 135–146)
TOTAL PROTEIN: 7.4 g/dL (ref 6.1–8.1)
Total Bilirubin: 0.4 mg/dL (ref 0.2–1.2)

## 2017-11-29 LAB — URINALYSIS
BILIRUBIN URINE: NEGATIVE
Glucose, UA: NEGATIVE
Hgb urine dipstick: NEGATIVE
KETONES UR: NEGATIVE
Leukocytes, UA: NEGATIVE
Nitrite: NEGATIVE
PH: 7 (ref 5.0–8.0)
Protein, ur: NEGATIVE
SPECIFIC GRAVITY, URINE: 1.025 (ref 1.001–1.03)

## 2017-11-29 LAB — RPR: RPR Ser Ql: NONREACTIVE

## 2017-11-29 LAB — HIV-1 RNA QUANT-NO REFLEX-BLD
HIV 1 RNA QUANT: 28 {copies}/mL — AB
HIV-1 RNA QUANT, LOG: 1.45 {Log_copies}/mL — AB

## 2017-11-29 LAB — HEPATITIS A ANTIBODY, TOTAL: HEPATITIS A AB,TOTAL: NONREACTIVE

## 2017-12-23 ENCOUNTER — Encounter: Payer: Self-pay | Admitting: Infectious Diseases

## 2018-01-04 ENCOUNTER — Telehealth: Payer: Self-pay | Admitting: *Deleted

## 2018-01-04 NOTE — Telephone Encounter (Signed)
Referral received during Viral load suppression meeting at [name]. Dr. order is for me to begin engagement attempts with the patient and offer assistance as needed. Focus should be on addressing the patient's barrier to care and medication adherence. Call made today and unable to receive an answer. I will continue to try and make contact with Carl Bradley to offer additional services with Mr. [name]'s care.

## 2018-02-15 ENCOUNTER — Telehealth: Payer: Self-pay | Admitting: Infectious Diseases

## 2018-02-15 NOTE — Telephone Encounter (Signed)
Attempted to call Bonita Quin re: Shon Hale.   I have been discussing with [name] about Junaid - His mother has been granted formal guardianship over him. Awaiting some psych testing formally.   His elopement and impulsivity/mood lability has escalated. I would like to start him on some seroquel QHS to try to stabilize his mood. Will start 25mg  QD and increase after 1-2 weeks to 50 mg if needed.   I left a vm to call back but she does not check her messages. Will reach out to [facility].

## 2018-02-18 ENCOUNTER — Telehealth: Payer: Self-pay | Admitting: *Deleted

## 2018-02-18 NOTE — Telephone Encounter (Signed)
Patient's mother brought in [facility] paperwork, spoke with [place]. Judeth Cornfield will follow up with her 2/11 by phone. Andree Coss, RN

## 2018-02-19 ENCOUNTER — Telehealth: Payer: Self-pay

## 2018-02-19 MED ORDER — BUPROPION HCL ER (SR) 150 MG PO TB12: ORAL_TABLET | ORAL | 0 refills | Status: DC

## 2018-02-19 MED ORDER — SERTRALINE HCL 25 MG PO TABS: 25.0000 mg | ORAL_TABLET | Freq: Every day | ORAL | 0 refills | Status: DC

## 2018-02-19 NOTE — Telephone Encounter (Signed)
Called to discuss with Carl Bradley - will start wellbutrin and zoloft.   Paperwork completed for FMLA as requested - at the front desk for her to pick up.

## 2018-02-19 NOTE — Telephone Encounter (Signed)
Per Rexene Alberts, Np called patient's mother to let her know that FMLA paperwork is complete,however, Patient's mother must complete her section of the form before it can be sent out. Spoke with Bonita Quin was able to take my call. States she will stop by the office after work to pick up paperwork. Patient's mother understands she must fill out her part before she can fax it. Lorenso Courier, [place]

## 2018-02-19 NOTE — Addendum Note (Signed)
Addended by: Blanchard Kelch on: 02/19/2018 12:58 PM   Modules accepted: Orders

## 2018-02-27 ENCOUNTER — Other Ambulatory Visit: Payer: Self-pay

## 2018-02-27 ENCOUNTER — Encounter: Payer: Self-pay | Admitting: Infectious Diseases

## 2018-02-27 DIAGNOSIS — B20 Human immunodeficiency virus [HIV] disease: Secondary | ICD-10-CM

## 2018-02-28 LAB — T-HELPER CELL (CD4) - (RCID CLINIC ONLY)
CD4 % Helper T Cell: 16 % — ABNORMAL LOW (ref 33–55)
CD4 T CELL ABS: 110 /uL — AB (ref 400–2700)

## 2018-03-01 ENCOUNTER — Telehealth: Payer: Self-pay | Admitting: Infectious Diseases

## 2018-03-01 LAB — LIPID PANEL
CHOL/HDL RATIO: 4.1 (calc) (ref <5.0)
CHOLESTEROL: 157 mg/dL (ref <200)
HDL: 38 mg/dL — AB (ref >=40)
LDL CHOLESTEROL (CALC): 99 mg/dL
Non-HDL Cholesterol (Calc): 119 mg/dL (calc) (ref <130)
TRIGLYCERIDES: 105 mg/dL (ref <150)

## 2018-03-01 LAB — COMPREHENSIVE METABOLIC PANEL
AG Ratio: 1.2 (calc) (ref 1.0–2.5)
ALBUMIN MSPROF: 3.9 g/dL (ref 3.6–5.1)
ALKALINE PHOSPHATASE (APISO): 159 U/L — AB (ref 36–130)
ALT: 13 U/L (ref 9–46)
AST: 10 U/L (ref 10–40)
BILIRUBIN TOTAL: 0.4 mg/dL (ref 0.2–1.2)
BUN: 8 mg/dL (ref 7–25)
CALCIUM: 9.3 mg/dL (ref 8.6–10.3)
CO2: 28 mmol/L (ref 20–32)
Chloride: 106 mmol/L (ref 98–110)
Creat: 1.14 mg/dL (ref 0.60–1.35)
Globulin: 3.2 g/dL (calc) (ref 1.9–3.7)
Glucose, Bld: 66 mg/dL (ref 65–99)
Potassium: 4.7 mmol/L (ref 3.5–5.3)
Sodium: 140 mmol/L (ref 135–146)
Total Protein: 7.1 g/dL (ref 6.1–8.1)

## 2018-03-01 LAB — CBC
HCT: 38.2 % — ABNORMAL LOW (ref 38.5–50.0)
HEMOGLOBIN: 12.7 g/dL — AB (ref 13.2–17.1)
MCH: 28 pg (ref 27.0–33.0)
MCHC: 33.2 g/dL (ref 32.0–36.0)
MCV: 84.1 fL (ref 80.0–100.0)
MPV: 9.8 fL (ref 7.5–12.5)
Platelets: 455 10*3/uL — ABNORMAL HIGH (ref 140–400)
RBC: 4.54 10*6/uL (ref 4.20–5.80)
RDW: 18.6 % — ABNORMAL HIGH (ref 11.0–15.0)
WBC: 4.6 10*3/uL (ref 3.8–10.8)

## 2018-03-01 LAB — HIV-1 RNA QUANT-NO REFLEX-BLD
HIV 1 RNA QUANT: DETECTED {copies}/mL — AB
HIV-1 RNA QUANT, LOG: DETECTED {Log_copies}/mL — AB

## 2018-03-01 NOTE — Telephone Encounter (Signed)
HIV 1 RNA Quant (copies/mL)  Date Value  02/27/2018 <20 DETECTED (A)  11/27/2017 28 (H)  07/30/2017 69 (H)   CD4 T Cell Abs (/uL)  Date Value  02/27/2018 110 (L)  11/27/2017 90 (L)  07/30/2017 70 (L)   Called Evaan's mom to let her know of current lab results. I left a voicemail requesting call back.   She had concerns when I last talked to her about Benzion not taking all of his medications like he should but he is undetectable and would like her to know.

## 2018-03-13 ENCOUNTER — Encounter: Payer: Self-pay | Admitting: Infectious Diseases

## 2018-03-22 ENCOUNTER — Other Ambulatory Visit: Payer: Self-pay

## 2018-03-22 ENCOUNTER — Encounter: Payer: Self-pay | Admitting: Infectious Diseases

## 2018-03-22 ENCOUNTER — Ambulatory Visit (INDEPENDENT_AMBULATORY_CARE_PROVIDER_SITE_OTHER): Payer: Self-pay | Admitting: Infectious Diseases

## 2018-03-22 VITALS — BP 118/71 | HR 76 | Temp 98.3°F | Ht 72.0 in | Wt 152.0 lb

## 2018-03-22 DIAGNOSIS — Z0000 Encounter for general adult medical examination without abnormal findings: Secondary | ICD-10-CM

## 2018-03-22 DIAGNOSIS — B20 Human immunodeficiency virus [HIV] disease: Secondary | ICD-10-CM

## 2018-03-22 DIAGNOSIS — R419 Unspecified symptoms and signs involving cognitive functions and awareness: Secondary | ICD-10-CM

## 2018-03-22 NOTE — Patient Instructions (Addendum)
Please continue your biktarvy (little red pill) and bactrim (Malstrom pill) once a day   Please continue the wellbutrin as you are taking now.    Please come back in 2 months to check in - we can do labs at the visit

## 2018-03-22 NOTE — Progress Notes (Signed)
Name: Carl Bradley  DOB: 1990-08-27 MRN: [id number]  PCP: Blanchard Kelch, NP   Patient Active Problem List   Diagnosis Date Noted   AIDS (acquired immune deficiency syndrome) (HCC) 03/30/2018   Vitamin B12 deficiency 05/17/2017   Diarrhea 05/17/2017   Neurocognitive disorder 02/21/2017   Tobacco use 10/24/2016   Healthcare maintenance 10/24/2016   HIV (human immunodeficiency virus infection) (HCC) 08/06/2009   SUBJECTIVE:  Brief Narrative: Carl Bradley is a 28 y.o. AA male with HIV infection diagnosed in 2011 after being tested to donate blood in high school; HIV RNA in 10/2009 with VL 197,000 and apparently his CD4 was < 200 at that time. Defered any treatment until late 2018. CD4 nadir 30. OI's: Oral Thrush, shingles. HIV Risk: MSM.   Previous Regimen:   Biktarvy --> VL < 50  Genotype:   11/2016 - sensitive    CC:  Here for HIV/AIDS follow up.    HPI:  Carl Bradley is here today for routine follow-up.  He was brought to the clinic by Carl Bradley counselor.  He tells me he takes his medications all of the time . " I do what I am told."  He cannot tell me what medications he is currently taking.  Nor how many.  He had first tells me that nothing is going on at home and that his grandmother annoys him.  He at first denies that he continues to make impulsive decisions especially surrounding obtaining cigarettes.  After we discussed this further and bring up the fact that his mother says he still tries to sneak out the windows he agrees that this is happening.   We texted his mother during the visit to see how she feels Carl Bradley was doing.  She says that he is taking his medications every day including the Biktarvy, Lexapro, Wellbutrin twice a day.  She has not noticed any improvement in his behavior and now has to lock the windows shut to keep him safe.  She now has official guardianship over him and documentation validating this.  We have not been able to do formal neurocognitive  testing at this point due to delays in nonessential medical care related to current outbreak related to a COVID-19.    Review of Systems: History obtained from mother and the patient General ROS: positive for  - sleep disturbance negative for - chills, fatigue, fever, malaise, night sweats or weight change Psychological ROS: positive for - concentration difficulties, irritability, memory difficulties, obsessive thoughts and sleep disturbances negative for - hallucinations, hostility or suicidal ideation Respiratory ROS: no cough, shortness of breath, or wheezing Cardiovascular ROS: no chest pain or dyspnea on exertion Gastrointestinal ROS: no abdominal pain, change in bowel habits, or black or bloody stools Genito-Urinary ROS: no dysuria, trouble voiding, or hematuria Neurological ROS: positive for - gait/balance disturbance and memory loss negative for - numbness/tingling, tremors, visual changes or weakness Dermatological ROS: negative   Past Medical History:  Diagnosis Date   HIV disease (HCC)    Tobacco use 10/24/2016   Outpatient Medications Prior to Visit  Medication Sig Dispense Refill   bictegravir-emtricitabine-tenofovir AF (BIKTARVY) 50-200-25 MG TABS tablet Take 1 tablet by mouth daily. Try to take at the same time each day with or without food. 30 tablet 11   clotrimazole-betamethasone (LOTRISONE) cream Apply to affected area 2 times daily prn 15 g 0   cyanocobalamin 1000 MCG tablet Take 1 tablet (1,000 mcg total) by mouth daily. 30 tablet 0   diphenoxylate-atropine (  LOMOTIL) 2.5-0.025 MG tablet Take 1 tablet by mouth 4 (four) times daily as needed for diarrhea or loose stools. 30 tablet 2   sulfamethoxazole-trimethoprim (BACTRIM) 400-80 MG tablet Take 1 tablet by mouth daily. 30 tablet 11   triamcinolone lotion (KENALOG) 0.1 % Apply 1 application topically 3 (three) times daily. 60 mL 0   buPROPion (WELLBUTRIN SR) 150 MG 12 hr tablet Take 1 tablet (150 mg total)  by mouth daily for 3 days, THEN 1 tablet (150 mg total) 2 (two) times daily for 27 days. 60 tablet 0   sertraline (ZOLOFT) 25 MG tablet Take 1 tablet (25 mg total) by mouth daily. 30 tablet 0   No facility-administered medications prior to visit.    No Known Allergies  Social History   Tobacco Use   Smoking status: Current Every Day Smoker    Packs/day: 0.75    Years: 10.00    Pack years: 7.50    Types: Cigarettes   Smokeless tobacco: Never Used  Substance Use Topics   Alcohol use: Yes    Comment: 04/27/2017 "a drink q 2-3 months"   Drug use: Yes    Types: Marijuana    Comment: 04/27/2017 "nothing in a couple months"    OBJECTIVE: Vitals:   03/22/18 1026  BP: 118/71  Pulse: 76  Temp: 98.3 F (36.8 C)     Physical Exam  Constitutional: He is oriented to person, place, and time. Vital signs are normal. Well-appearing, clean and wearing appropriate clothing for the weather. HENT: Oropharynx is clear and moist. Very poor dentition with multiple erosive caries gingivitis. Eyes: Pupils are equal, round, and reactive to light. No scleral icterus.  Cardiovascular: Normal rate, regular rhythm and normal heart sounds.  Pulmonary/Chest: Effort normal and breath sounds normal. No respiratory distress.  Abdominal: Soft. Bowel sounds are normal. He exhibits no distension. There is no tenderness.  Musculoskeletal: Normal range of motion. He exhibits no edema. Normal strength.  Lymphadenopathy: Lymphadenopathy appreciated.  Neurological: He is alert and oriented to person, place, and time.  Slow and sometimes shuffled gait. Skin: Skin is warm and dry. No rash noted. He is not diaphoretic.  Psychiatric: He is quite hyper today with regards to his speech thought pattern making our conversation difficult.  He is pleasant and not irritable or agitated today.  He expresses impulsivity.  Abnormal short-term memory he echoes what I am saying constantly throughout the visit today and  interjects inappropriately during conversation.   Lab Results Lab Results  Component Value Date   WBC 4.6 02/27/2018   HGB 12.7 (L) 02/27/2018   HCT 38.2 (L) 02/27/2018   MCV 84.1 02/27/2018   PLT 455 (H) 02/27/2018    Lab Results  Component Value Date   CREATININE 1.14 02/27/2018   BUN 8 02/27/2018   NA 140 02/27/2018   K 4.7 02/27/2018   CL 106 02/27/2018   CO2 28 02/27/2018    Lab Results  Component Value Date   ALT 13 02/27/2018   AST 10 02/27/2018   ALKPHOS 94 04/27/2017   BILITOT 0.4 02/27/2018    HIV 1 RNA Quant (copies/mL)  Date Value  02/27/2018 <20 DETECTED (A)  11/27/2017 28 (H)  07/30/2017 69 (H)   CD4 T Cell Abs (/uL)  Date Value  02/27/2018 110 (L)  11/27/2017 90 (L)  07/30/2017 70 (L)   Assessment and Plan:  Problem List Items Addressed This Visit      Unprioritized   HIV (human immunodeficiency virus infection) (HCC) -  Primary    I told Vladimir that his medications are working very well for him today.  His last viral load back in February was undetectable.  I asked him to please share this news with his mother.  CD4 count still below 200.  He is doing well taking his medications in a very controlled environment with his mother and grandmother's help. If it was not for his guardians Roc would be very bad off and unable to care for himself regarding basic life essentials let alone his complex medical issues.  He is not sexually active and has not been for some time.  Had a negative RPR back in November.  Will check urine for gonorrhea and chlamydia at upcoming appointment as he already emptied his bladder.   Return in about 2 months (around 05/22/2018).       Healthcare maintenance    He is due for his Prevnar vaccine after April of this year.  Will call his mother to obtain consent for second Menveo and HPV next visit.      Neurocognitive disorder    Continues to be impulsive, poor insight into overall condition and consequences for his actions.   Cognitive-psychological testing has been on hold unfortunately.  I would really like to get him back into neurology to see if they can offer any guidance in regards to controlling some of his symptoms.  He does not yet have Medicaid and financial difficulty to afford this specialty care visit.  May need to increase Wellbutrin at next visit if he does not seem to have improvement.      AIDS (acquired immune deficiency syndrome) (HCC)    He has no signs of opportunistic infection on exam today.  However CD4 count remains low at 110.  He is to continue his Bactrim prophylaxis 1 double strength tablet daily.  Have him return in 2 months time so we can keep a close eye on [place].  We will do lab work at that time and reassess his CD4 count and viral load.         Rexene Alberts, MSN, NP-C [facility]  [email or web address] Pager: [phone] Office: [phone] [facility] Main Line: [phone]

## 2018-03-27 ENCOUNTER — Other Ambulatory Visit: Payer: Self-pay | Admitting: Behavioral Health

## 2018-03-27 ENCOUNTER — Other Ambulatory Visit: Payer: Self-pay | Admitting: Infectious Diseases

## 2018-03-30 ENCOUNTER — Encounter: Payer: Self-pay | Admitting: Infectious Diseases

## 2018-03-30 DIAGNOSIS — B20 Human immunodeficiency virus [HIV] disease: Secondary | ICD-10-CM | POA: Insufficient documentation

## 2018-03-30 NOTE — Assessment & Plan Note (Signed)
He has no signs of opportunistic infection on exam today.  However CD4 count remains low at 110.  He is to continue his Bactrim prophylaxis 1 double strength tablet daily.  Have him return in 2 months time so we can keep a close eye on [place].  We will do lab work at that time and reassess his CD4 count and viral load.

## 2018-03-30 NOTE — Assessment & Plan Note (Signed)
I told Carl Bradley that his medications are working very well for him today.  His last viral load back in February was undetectable.  I asked him to please share this news with his mother.  CD4 count still below 200.  He is doing well taking his medications in a very controlled environment with his mother and grandmother's help. If it was not for his guardians Carl Bradley would be very bad off and unable to care for himself regarding basic life essentials let alone his complex medical issues.  He is not sexually active and has not been for some time.  Had a negative RPR back in November.  Will check urine for gonorrhea and chlamydia at upcoming appointment as he already emptied his bladder.   Return in about 2 months (around 05/22/2018).

## 2018-03-30 NOTE — Assessment & Plan Note (Signed)
Continues to be impulsive, poor insight into overall condition and consequences for his actions.  Cognitive-psychological testing has been on hold unfortunately.  I would really like to get him back into neurology to see if they can offer any guidance in regards to controlling some of his symptoms.  He does not yet have Medicaid and financial difficulty to afford this specialty care visit.  May need to increase Wellbutrin at next visit if he does not seem to have improvement.

## 2018-03-30 NOTE — Assessment & Plan Note (Addendum)
He is due for his Prevnar vaccine after April of this year.  Will call his mother to obtain consent for second Menveo and HPV next visit.

## 2018-04-21 ENCOUNTER — Other Ambulatory Visit: Payer: Self-pay | Admitting: Infectious Diseases

## 2018-06-04 ENCOUNTER — Other Ambulatory Visit: Payer: Self-pay

## 2018-06-04 ENCOUNTER — Ambulatory Visit (INDEPENDENT_AMBULATORY_CARE_PROVIDER_SITE_OTHER): Payer: Self-pay | Admitting: Infectious Diseases

## 2018-06-04 ENCOUNTER — Encounter: Payer: Self-pay | Admitting: Infectious Diseases

## 2018-06-04 VITALS — BP 122/76 | HR 63 | Temp 98.1°F | Wt 160.0 lb

## 2018-06-04 DIAGNOSIS — R419 Unspecified symptoms and signs involving cognitive functions and awareness: Secondary | ICD-10-CM

## 2018-06-04 DIAGNOSIS — B20 Human immunodeficiency virus [HIV] disease: Secondary | ICD-10-CM

## 2018-06-04 DIAGNOSIS — R269 Unspecified abnormalities of gait and mobility: Secondary | ICD-10-CM

## 2018-06-04 DIAGNOSIS — Z23 Encounter for immunization: Secondary | ICD-10-CM

## 2018-06-04 DIAGNOSIS — Z0000 Encounter for general adult medical examination without abnormal findings: Secondary | ICD-10-CM

## 2018-06-04 NOTE — Assessment & Plan Note (Signed)
We reviewed his recent lab work with undetectable viral load back in February of this year.  I re-enforced again what this means in regards to his treatment.  He has not had any improvement in his neurocognitive symptoms despite having adequate control over his HIV.  We will continue his Biktarvy once a day.  He is completely dependent on his mother to ensure that he takes his medications appropriately.  Unfortunately if he is in an unsupervised setting I fear in the future he will have a very poor outcome. Return in about 3 months (around 09/04/2018).

## 2018-06-04 NOTE — Assessment & Plan Note (Signed)
Carl Bradley continues to have poor insight overall and is quite impulsive and unable to care for himself.  Her cognitive-psychological testing has been delayed unfortunately due to COVID-19 outbreak.  We will refer him back to neurology for assistance in management and any recommendations that they have to control some of his symptoms.  Previous work-up has been completely unrevealing aside from his untreated and severely advanced HIV disease.  His last MRI was in April 2019 revealing Advanced cerebral atrophy for age with associated fairly symmetric and hazy T2/FLAIR signal abnormality involving the bilateral cerebral [place] matter. 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 Lumbar puncture at that time was also unremarkable.

## 2018-06-04 NOTE — Assessment & Plan Note (Addendum)
Continue Bactrim once daily for prophylaxis with CD4 count under 200.  We will repeat this today.  No signs of opportunistic infection on exam today.

## 2018-06-04 NOTE — Assessment & Plan Note (Signed)
?  apraxic gait -he has full sensation and no weakness in the lower extremities.  His muscular tone is normal and equal bilaterally.  His Romberg test was negative.  This may be due to to previously identified vitamin B12 deficiency but more likely related to HIV neurocognitive related changes.  Referral to neurology for assistance and recommendations for management.

## 2018-06-04 NOTE — Patient Instructions (Addendum)
Will have you meet with Claris Che our referral coordinator to work on a neurology appointment - I am referring him to help evaluate some of his memory/behavioral challenges as well as the shaking and difficulty with stairs.   Will check your blood work today to see how you are doing with treatment of your HIV.   Please continue taking your Biktarvy (red pill) and Bactrim (Boeve pill) once daily.   Please continue Zoloft. Can stop the Wellbutrin if you find that this is not helping the smoking habit.   Will have you return in 3 months to check in again.   We updated your pneumonia and meningitis vaccines today. Annual flu shot the fall.

## 2018-06-04 NOTE — Assessment & Plan Note (Signed)
Prevnar vaccine and meningococcal #2 dose today.  HPV series to start at upcoming appointment.

## 2018-06-04 NOTE — Progress Notes (Signed)
Name: Carl Bradley  DOB: 01-19-1990 MRN: [id number]  PCP: Blanchard Kelchixon, Arshan Jabs N, NP   Patient Active Problem List   Diagnosis Date Noted  . Gait disturbance 06/04/2018  . AIDS (acquired immune deficiency syndrome) (HCC) 03/30/2018  . Vitamin B12 deficiency 05/17/2017  . Neurocognitive disorder 02/21/2017  . Tobacco use 10/24/2016  . Healthcare maintenance 10/24/2016  . HIV (human immunodeficiency virus infection) (HCC) 08/06/2009    SUBJECTIVE: Brief Narrative: Carl Bradley is a 28 y.o. AA male with HIV, Dx in 2011 after being tested to donate blood in high school; HIV RNA in 10/2009 with VL 197,000 and apparently his CD4 was < 200 at that time. Defered any treatment until late 2018.  CD4 nadir 30. OI's: Oral Thrush, shingles.  HIV Risk: MSM.   Previous Regimen:   Biktarvy --> VL < 50  Genotype:   11/2016 - sensitive    CC:  Here for HIV/AIDS follow up. Mom present - worried about shaking, drinking a lot of sugary drinks, impulsivity.    HPI:  Carl Bradley is here with his mother today for follow-up of his HIV care.  Carl Bradley tells me that he has been doing very well he only missed 2 doses of his medication.  He says he usually takes his medicine around 10:00 and he is able to rely on himself to remember to take medications.  His mother who was sitting next to him actively shakes her head no and remarks that it is her that is his daily reminder and he is unable to be trusted to do this task on his own.  Carl Bradley has had no concerns with side effects to his Biktarvy.  Continues to tolerate it well.  He is also taking his Bactrim once a day as instructed.  He has had no new sexual partners or encounters since her last office visit together.  Reports no concern over depression or anxiety.  Both he and his mother report that his sleep has improved since starting Zoloft, however his smoking habit has continued despite Wellbutrin.  His mother is concerned again regarding his impulsivity, inappropriate  behavior (specifically cites attempting to urinate in the kitchen and bathroom sink), consumption of frequent sugary drinks and foods without control, difficulty walking up the stairs and shaking/tremoring of lower extremities when he gets off balance.  He is very hesitant walking up the stairs and cannot do this 1 foot at a time like he was once able to do.  He walks with a shuffle gated and slow according to both he and his mother.  He has not had any falls or near falls in the home.  He reports no headache or visual changes.  They are unsure if he still taking his vitamin B12.   Review of Systems: History obtained from mother and the patient General ROS: negative for - chills, fatigue, fever, malaise, night sweats or weight change Psychological ROS: positive for - concentration difficulties, irritability, memory difficulties, obsessive thoughts and sleep disturbances negative for - hallucinations, hostility or suicidal ideation Respiratory ROS: no cough, shortness of breath, or wheezing Cardiovascular ROS: no chest pain or dyspnea on exertion Gastrointestinal ROS: no abdominal pain, change in bowel habits, or black or bloody stools Genito-Urinary ROS: no dysuria, trouble voiding, or hematuria Neurological ROS: positive for - gait/balance disturbance and memory loss, tremors; negative for - numbness/tingling, visual changes or weakness Dermatological ROS: negative   Past Medical History:  Diagnosis Date  . HIV disease (HCC)   . Tobacco use  10/24/2016   Outpatient Medications Prior to Visit  Medication Sig Dispense Refill  . bictegravir-emtricitabine-tenofovir AF (BIKTARVY) 50-200-25 MG TABS tablet Take 1 tablet by mouth daily. Try to take at the same time each day with or without food. 30 tablet 11  . buPROPion (WELLBUTRIN SR) 150 MG 12 hr tablet TAKE 1 TABLET(150 MG) BY MOUTH DAILY FOR 3 DAYS THEN TAKE 1 TABLET(150 MG) BY MOUTH TWICE DAILY FOR 27 DAYS 60 tablet 2  .  clotrimazole-betamethasone (LOTRISONE) cream Apply to affected area 2 times daily prn 15 g 0  . cyanocobalamin 1000 MCG tablet Take 1 tablet (1,000 mcg total) by mouth daily. 30 tablet 0  . diphenoxylate-atropine (LOMOTIL) 2.5-0.025 MG tablet Take 1 tablet by mouth 4 (four) times daily as needed for diarrhea or loose stools. 30 tablet 2  . sertraline (ZOLOFT) 25 MG tablet TAKE 1 TABLET(25 MG) BY MOUTH DAILY 30 tablet 2  . sulfamethoxazole-trimethoprim (BACTRIM) 400-80 MG tablet Take 1 tablet by mouth daily. 30 tablet 11  . triamcinolone lotion (KENALOG) 0.1 % Apply 1 application topically 3 (three) times daily. 60 mL 0   No facility-administered medications prior to visit.    No Known Allergies  Social History   Tobacco Use  . Smoking status: Current Every Day Smoker    Packs/day: 0.75    Years: 10.00    Pack years: 7.50    Types: Cigarettes  . Smokeless tobacco: Never Used  Substance Use Topics  . Alcohol use: Yes    Comment: 04/27/2017 "a drink q 2-3 months"  . Drug use: Yes    Types: Marijuana    Comment: 04/27/2017 "nothing in a couple months"    OBJECTIVE: Vitals:   06/04/18 1038  BP: 122/76  Pulse: 63  Temp: 98.1 F (36.7 C)     Physical Exam  Constitutional: He is oriented to person, place, and time. Vital signs are normal. Well-appearing, clean and wearing appropriate clothing for the weather. HENT: Oropharynx is clear and moist. Very poor dentition with multiple erosive caries gingivitis. Eyes: Pupils are equal, round, and reactive to light. No scleral icterus.  Cardiovascular: Normal rate, regular rhythm and normal heart sounds.  Pulmonary/Chest: Effort normal and breath sounds normal. No respiratory distress.  Abdominal: Soft. Bowel sounds are normal. He exhibits no distension. There is no tenderness.  Musculoskeletal: Normal range of motion. He exhibits no edema. Normal strength bilaterally.  No evidence of foot drop.  Full equal sensation in both lower  extremities bilaterally. Lymphadenopathy: Lymphadenopathy appreciated.  Neurological: He is alert and oriented to person, place, and time. Negative Romberg test.  He has good muscular tone and coordination efforts on exam today.  Gait appears apraxic. He is pleasant and not irritable or agitated today.  He expresses impulsivity.  Abnormal short-term memory. Echoes what I am saying constantly throughout the visit today and interjects inappropriately during conversation.  I do not observe any of the shaking or tremoring that his mother was referring to. Skin: Skin is warm and dry. No rash noted. He is not diaphoretic.  Psychiatric: He is in a good mood today.  Pleasant and smiling.   Lab Results Lab Results  Component Value Date   WBC 4.6 02/27/2018   HGB 12.7 (L) 02/27/2018   HCT 38.2 (L) 02/27/2018   MCV 84.1 02/27/2018   PLT 455 (H) 02/27/2018    Lab Results  Component Value Date   CREATININE 1.14 02/27/2018   BUN 8 02/27/2018   NA 140 02/27/2018  K 4.7 02/27/2018   CL 106 02/27/2018   CO2 28 02/27/2018    Lab Results  Component Value Date   ALT 13 02/27/2018   AST 10 02/27/2018   ALKPHOS 94 04/27/2017   BILITOT 0.4 02/27/2018    HIV 1 RNA Quant (copies/mL)  Date Value  02/27/2018 <20 DETECTED (A)  11/27/2017 28 (H)  07/30/2017 69 (H)   CD4 T Cell Abs (/uL)  Date Value  02/27/2018 110 (L)  11/27/2017 90 (L)  07/30/2017 70 (L)   Assessment and Plan:  Problem List Items Addressed This Visit      Unprioritized   AIDS (acquired immune deficiency syndrome) (HCC) - Primary    Continue Bactrim once daily for prophylaxis with CD4 count under 200.  We will repeat this today.  No signs of opportunistic infection on exam today.      Relevant Orders   HIV-1 RNA quant-no reflex-bld   T-helper cell (CD4)- ([facility] clinic only)   Gait disturbance    ?apraxic gait -he has full sensation and no weakness in the lower extremities.  His muscular tone is normal and equal  bilaterally.  His Romberg test was negative.  This may be due to to previously identified vitamin B12 deficiency but more likely related to HIV neurocognitive related changes.  Referral to neurology for assistance and recommendations for management.      Healthcare maintenance    Prevnar vaccine and meningococcal #2 dose today.  HPV series to start at upcoming appointment.      HIV (human immunodeficiency virus infection) (HCC)    We reviewed his recent lab work with undetectable viral load back in February of this year.  I re-enforced again what this means in regards to his treatment.  He has not had any improvement in his neurocognitive symptoms despite having adequate control over his HIV.  We will continue his Biktarvy once a day.  He is completely dependent on his mother to ensure that he takes his medications appropriately.  Unfortunately if he is in an unsupervised setting I fear in the future he will have a very poor outcome. Return in about 3 months (around 09/04/2018).       Neurocognitive disorder    Tyde continues to have poor insight overall and is quite impulsive and unable to care for himself.  Her cognitive-psychological testing has been delayed unfortunately due to COVID-19 outbreak.  We will refer him back to neurology for assistance in management and any recommendations that they have to control some of his symptoms.  Previous work-up has been completely unrevealing aside from his untreated and severely advanced HIV disease.  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 Lumbar puncture at that time was also unremarkable.       Relevant Orders   Ambulatory referral to Neurology    Other Visit Diagnoses    Need for meningococcal vaccination       Relevant Orders    MENINGOCOCCAL MCV4O (Completed)   Need for vaccination with 13-polyvalent pneumococcal conjugate vaccine       Relevant Orders   Pneumococcal conjugate vaccine 13-valent IM (Completed)      Rexene Alberts, MSN, NP-C [facility]  [email or web address] Pager: [phone] Office: [phone] [facility] Main Line: [phone]

## 2018-06-05 ENCOUNTER — Ambulatory Visit: Payer: Self-pay | Admitting: Diagnostic Neuroimaging

## 2018-06-05 LAB — T-HELPER CELL (CD4) - (RCID CLINIC ONLY)
CD4 % Helper T Cell: 22 % — ABNORMAL LOW (ref 33–65)
CD4 T Cell Abs: 108 /uL — ABNORMAL LOW (ref 400–1790)

## 2018-06-09 LAB — HIV-1 RNA QUANT-NO REFLEX-BLD
HIV 1 RNA Quant: 76 copies/mL — ABNORMAL HIGH
HIV-1 RNA Quant, Log: 1.88 Log copies/mL — ABNORMAL HIGH

## 2018-06-18 ENCOUNTER — Telehealth (INDEPENDENT_AMBULATORY_CARE_PROVIDER_SITE_OTHER): Payer: Self-pay | Admitting: Neurology

## 2018-06-18 ENCOUNTER — Other Ambulatory Visit: Payer: Self-pay

## 2018-06-18 ENCOUNTER — Encounter: Payer: Self-pay | Admitting: Neurology

## 2018-06-18 VITALS — BP 122/76 | Ht 72.0 in | Wt 160.0 lb

## 2018-06-18 DIAGNOSIS — B20 Human immunodeficiency virus [HIV] disease: Secondary | ICD-10-CM

## 2018-06-18 DIAGNOSIS — R251 Tremor, unspecified: Secondary | ICD-10-CM

## 2018-06-18 DIAGNOSIS — R269 Unspecified abnormalities of gait and mobility: Secondary | ICD-10-CM

## 2018-06-18 DIAGNOSIS — G9340 Encephalopathy, unspecified: Secondary | ICD-10-CM

## 2018-06-18 NOTE — Progress Notes (Signed)
Virtual Visit via Video Note The purpose of this virtual visit is to provide medical care while limiting exposure to the novel coronavirus.    Consent was obtained for video visit:  Yes.   Answered questions that patient had about telehealth interaction:  Yes.   I discussed the limitations, risks, security and privacy concerns of performing an evaluation and management service by telemedicine. I also discussed with the patient that there may be a patient responsible charge related to this service. The patient expressed understanding and agreed to proceed.  Pt location: Home Physician Location: office Name of referring provider:  Emmet Callas, NP I connected with Tillie Rung III at patients initiation/request on 06/18/2018 at 10:00 AM EDT by video enabled telemedicine application and verified that I am speaking with the correct person using two identifiers. Pt MRN:  [id number] Pt DOB:  1990-02-16 Video Participants:  Tillie Rung III;  Ladona Ridgel (mother)   History of Present Illness:  This is a pleasant 28 year old right-handed man with a history of HIV, tobacco use, presenting for evaluation of gait disturbance and neurocognitive changes. He states his memory is "alright," his mother reports he has been living with her since 2018 and had cognitive issues at that time. He repeats everything, even things said on TV, "like a recorder." He does not drive. Mother manages finances. He takes his own medications but his mother states he would forget about 1-2 times a week if she did not remind him. She is concerned about behavioral changes that she thinks started only the past month. She is unsure exactly when it started but she has been working from home now and has caught him urinating in the kitchen sink and bathroom sink. Over the past month, he has also been having enuresis, she states it occurs every night except for the past 2 days when she cut back on sweets and caffeine. She states he is  quite impulsive, he would try to drink a lot of caffeine, including soda, coffee, adding syrup or jelly in his coffee. He has been doing this over the past year that they have had to lock up food. He wanders outside and has snuck out of windows to get out of the house. She would calls that he is walking in the middle of the street, sometimes she cannot find him and he would come home several hours later. She states he does this to get a cigarette, "he will do anything for a cigarette." He has been on Wellbutrin for a few months for smoking cessation. She also notes that he taps his right hand on his lap all day. She is not sure if this is due to anxiety. He has been on Sertraline for a few months.   His mother's concern are the tremors he has been having. She states they occur every time he stands up or goes up or down stairs. His leg would shake briefly then after 2 steps and as he continues walking, the shaking stops. Haidan states it is his right leg that shakes, his mother states both legs shake. He denies any difficulties walking once the shaking stops in a few steps. His mother feels his walking has slowed down when he started declining last year. She initially attributed this to bad calluses in his feet, but he still walks the same way despite treatment of calluses. He denies any shaking in his hands but his mother reports that they do shake but not as much. She  has also noticed that when he is sleeping, his whole body or legs shake to the point that she hears the bed shaking. He denies any numbness/tingling, neck/back pain. He denies any bowel/bladder issues except for the enuresis. He denies any headaches, dizziness, diplopia, dysarthria/dysphagia. Sleep is good. Mood "depends."    Laboratory Data: I personally reviewed MRI brain with and without contrast done April 2019 for cognitive decline. There was age-advanced diffuse cerebral atrophy, abnormal confluent hazy FLAIR changes in involving  periventricular and deep [place] matter of both cerebral hemispheres, changes most pronounced in the anterior frontal lobes bilaterally, signal extends along the cortical spinal tracts bilaterally. 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  He had a lumbar puncture in April 2019 with tube 4 showing RBC 86 (5125 tube 1), WBC 3, protein 52, glucose 52, CSF culture, fungal culture, VDRL, HSV, JC virus, crypto negative.  Lab Results  Component Value Date   CD4TCELL 22 (L) 06/04/2018   CD4TABS 108 (L) 06/04/2018    Lab Results  Component Value Date   VITAMINB12 459 07/30/2017   Lab Results  Component Value Date   TSH 4.940 (H) 04/28/2017     PAST MEDICAL HISTORY: Past Medical History:  Diagnosis Date   HIV disease (HCC)    Tobacco use 10/24/2016    PAST SURGICAL HISTORY: Past Surgical History:  Procedure Laterality Date   INCISION AND DRAINAGE PERIRECTAL ABSCESS N/A 07/29/2015   Procedure: IRRIGATION AND DEBRIDEMENT PERIRECTAL ABSCESS;  Surgeon: Glenna FellowsBenjamin Hoxworth, MD;  Location: [facility];  Service: General;  Laterality: N/A;    MEDICATIONS: Current Outpatient Medications on File Prior to Visit  Medication Sig Dispense Refill   bictegravir-emtricitabine-tenofovir AF (BIKTARVY) 50-200-25 MG TABS tablet Take 1 tablet by mouth daily. Try to take at the same time each day with or without food. 30 tablet 11   buPROPion (WELLBUTRIN SR) 150 MG 12 hr tablet TAKE 1 TABLET(150 MG) BY MOUTH DAILY FOR 3 DAYS THEN TAKE 1 TABLET(150 MG) BY MOUTH TWICE DAILY FOR 27 DAYS 60 tablet 2   cyanocobalamin 1000 MCG tablet Take 1 tablet (1,000 mcg total) by mouth daily. 30 tablet 0   sertraline (ZOLOFT) 25 MG tablet TAKE 1 TABLET(25 MG) BY MOUTH DAILY 30 tablet 2   sulfamethoxazole-trimethoprim (BACTRIM) 400-80 MG tablet Take 1 tablet by mouth daily. 30 tablet 11   No current facility-administered medications on file prior to visit.     ALLERGIES: No Known Allergies  FAMILY  HISTORY: Family History  Problem Relation Age of Onset   Healthy Mother    Healthy Father     Observations/Objective:   Vitals:   06/18/18 1008  BP: 122/76  Weight: 160 lb (72.6 kg)  Height: 6' (1.829 m)   GEN:  The patient appears stated age and is in NAD.  Neurological examination: Patient is awake, alert, oriented x 3. No aphasia or dysarthria. Intact fluency and comprehension. Remote and recent memory intact. Able to name and repeat. Cranial nerves: Extraocular movements intact with no nystagmus. No facial asymmetry. Motor: moves all extremities symmetrically, at least anti-gravity x 4. No incoordination on finger to nose testing. Gait: There was no tremor/shaking noted as he stood up with arms crossed over chest. Initially, he started walking with his left arm flexed in front of him, but after he was asked to walk with arms on his side, this was not noted. Gait is slightly wide-based, no ataxia. His repeatedly tapping his right hand on his lap. He is able  to tandem walk adequately. Positive sway with Romberg test.    Assessment and Plan:   This is a pleasant 28 year old right-handed man with a history of HIV, tobacco use, presenting for evaluation of gait and cognitive changes. His mother reports leg shaking when initially standing, resolving after taking 2 steps. Orthostatic tremor is a consideration. His mother is also concerned about gait changes, as well as behavioral changes where he is now having enuresis and waking up with the bed wet daily. Although limited on video, there is no tremor seen, his gait is slightly wide-based but no parkinsonian features noted. Etiology of symptoms unclear, his MRI brain in 2019 showed findings consistent with HIV encephalitis. With new symptoms, MRI brain with and without contrast will be ordered for interval change. His mother is also reporting shaking in his sleep, EEG will be done. Bloodwork for TSH and B12 will be ordered for completion. He will  be referred to PT for Balance and gait therapy. Follow-up in 3 months, they know to call for any changes.    Follow Up Instructions:    -I discussed the assessment and treatment plan with the patient/mother. The patient/mother was provided an opportunity to ask questions and all were answered. The patient/mother agreed with the plan and demonstrated an understanding of the instructions.   The patient was advised to call back or seek an in-person evaluation if the symptoms worsen or if the condition fails to improve as anticipated.   Van ClinesKaren M Darrall Strey, MD

## 2018-06-25 ENCOUNTER — Other Ambulatory Visit: Payer: Self-pay

## 2018-06-25 DIAGNOSIS — B20 Human immunodeficiency virus [HIV] disease: Secondary | ICD-10-CM

## 2018-06-25 DIAGNOSIS — R269 Unspecified abnormalities of gait and mobility: Secondary | ICD-10-CM

## 2018-06-25 DIAGNOSIS — G9340 Encephalopathy, unspecified: Secondary | ICD-10-CM

## 2018-06-27 ENCOUNTER — Other Ambulatory Visit: Payer: Self-pay

## 2018-06-27 ENCOUNTER — Ambulatory Visit: Payer: Medicaid Other | Attending: Neurology | Admitting: Physical Therapy

## 2018-06-27 ENCOUNTER — Encounter: Payer: Self-pay | Admitting: Physical Therapy

## 2018-06-27 DIAGNOSIS — R29898 Other symptoms and signs involving the musculoskeletal system: Secondary | ICD-10-CM | POA: Diagnosis present

## 2018-06-27 DIAGNOSIS — R2681 Unsteadiness on feet: Secondary | ICD-10-CM | POA: Diagnosis present

## 2018-06-27 DIAGNOSIS — R2689 Other abnormalities of gait and mobility: Secondary | ICD-10-CM

## 2018-06-27 NOTE — Therapy (Addendum)
[name] [address]  [place], [place], [address] Phone: [phone]   Fax:  [phone]  Physical Therapy Evaluation  Patient Details  Name: Carl Bradley MRN: [id number] Date of Birth: Aug 25, 1990 Referring Provider (PT): Ellouise Newer, MD   Encounter Date: 06/27/2018  PT End of Session - 06/27/18 1704    Visit Number  1    Number of Visits  7    Date for PT Re-Evaluation  08/08/18    Authorization Type  Self Pay    PT Start Time  1558    PT Stop Time  1646    PT Time Calculation (min)  48 min    Equipment Utilized During Treatment  Gait belt    Activity Tolerance  Patient tolerated treatment well    Behavior During Therapy  [facility] for tasks assessed/performed       Past Medical History:  Diagnosis Date  . HIV disease ([place])   . Tobacco use 10/24/2016    Past Surgical History:  Procedure Laterality Date  . INCISION AND DRAINAGE PERIRECTAL ABSCESS N/A 07/29/2015   Procedure: IRRIGATION AND DEBRIDEMENT PERIRECTAL ABSCESS;  Surgeon: Excell Seltzer, MD;  Location: [facility];  Service: General;  Laterality: N/A;    There were no vitals filed for this visit.   Subjective Assessment - 06/27/18 1559    Subjective  Patient present with mother. Patient reports that when he gets up from a chair, his L leg, then R leg starts shaking and is not sure why. Feels like his leg is going to buckle and that he will fall. Mom says that this has been going on for months. Denies weakness, N/T, incoordination/clumsiness. Denies recent falls.  Mom says that he fell off the bed about a year ago but is not sure if he was asleep. Patient reports that his balance is steady when he walks, but mom says that he shakes a lot. Denies furniture walking. Also shakes with bending down to the ground to pick something up and walking down steps.    Patient is accompained by:  Family member   mother   Pertinent History  HIV, AIDS, neurocognitive  disorder    Limitations  Lifting;Standing;Walking;House hold activities    Diagnostic tests  04/24/17 brain MRI: Advanced cerebral atrophy for age with associated fairly symmetric and hazy T2/FLAIR signal abnormality involving the bilateral cerebral Avedisian matter. 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    Patient Stated Goals  "i want to make my legs stronger than they already are"    Currently in Pain?  No/denies         [facility] PT Assessment - 06/27/18 1605      Assessment   Medical Diagnosis  HIV encephalopathy, symptomatic HIV infection, gait abnormality     Referring Provider (PT)  Ellouise Newer, MD    Onset Date/Surgical Date  03/27/18    Next MD Visit  not scheduled    Prior Therapy  no      Precautions   Precautions  --   HIV, AIDS, neurocognitive disorder     Restrictions   Weight Bearing Restrictions  No      Balance Screen   Has the patient fallen in the past 6 months  No      [place] residence    Living Arrangements  Parent    Available Help at Discharge  Family    Type of  Home  House    Home Access  Stairs to enter    Entrance Stairs-Number of Steps  3    Entrance Stairs-Rails  None    Home Layout  One level    Home Equipment  None      Prior Function   Level of Independence  Independent with basic ADLs    Vocation  On disability    Leisure  none      Cognition   Overall Cognitive Status  Within Functional Limits for tasks assessed    Behaviors  Perseveration      Observation/Other Assessments   Observations  repetitive tapping of R hand in sitting      Sensation   Light Touch  Appears Intact      Coordination   Gross Motor Movements are Fluid and Coordinated  Yes    Finger Nose Finger Test  B moderate intention tremor    Heel Shin Test  B WNL      Posture/Postural Control   Posture/Postural Control  Postural limitations    Postural Limitations  Rounded Shoulders;Posterior pelvic tilt       ROM / Strength   AROM / PROM / Strength  Strength;AROM      AROM   AROM Assessment Site  Ankle    Right/Left Ankle  Right;Left    Right Ankle Dorsiflexion  -3    Left Ankle Dorsiflexion  0      Strength   Strength Assessment Site  Hip;Knee;Ankle    Right/Left Hip  Right;Left    Right Hip Flexion  4/5    Right Hip ABduction  4+/5    Right Hip ADduction  4/5    Left Hip Flexion  4/5    Left Hip ABduction  4+/5    Left Hip ADduction  4/5    Right/Left Knee  Right;Left    Right Knee Flexion  4+/5    Right Knee Extension  4+/5    Left Knee Flexion  4/5    Left Knee Extension  4+/5    Right/Left Ankle  Right;Left    Right Ankle Dorsiflexion  4+/5    Right Ankle Plantar Flexion  4/5    Left Ankle Dorsiflexion  4+/5    Left Ankle Plantar Flexion  4/5      Ambulation/Gait   Assistive device  None    Gait Pattern  Step-through pattern;Poor foot clearance - right;Poor foot clearance - left;Decreased step length - right;Decreased step length - left;Wide base of support    Ambulation Surface  Level;Indoor    Gait velocity  decreased    Stairs  --      Balance   Balance Assessed  Yes      Static Standing Balance   Static Standing - Comment/# of Minutes  M-CTSIB EO/firm: WNL, EC/firm: WNL; EO/foam: WNL; EC/foam: mild sway   B LE shaking when initiating step up onto foam     Standardized Balance Assessment   Standardized Balance Assessment  Timed Up and Go Test;Dynamic Gait Index      Dynamic Gait Index   Level Surface  Moderate Impairment    Change in Gait Speed  Mild Impairment    Gait with Horizontal Head Turns  Normal    Gait with Vertical Head Turns  Normal    Gait and Pivot Turn  Normal    Step Over Obstacle  Mild Impairment    Step Around Obstacles  Normal    Steps  Severe Impairment  1 hand rail use, step-to pattern with L leading, shaking   Total Score  17    DGI comment:  1 episode of triping up stairs d/t catching toe on step      Timed Up and Go Test    Normal TUG (seconds)  17.09                Objective measurements completed on examination: See above findings.              PT Education - 06/27/18 1703    Education Details  prognosis, POC, HEP    Person(s) Educated  Patient;Parent(s)   mother   Methods  Explanation;Demonstration;Tactile cues;Verbal cues;Handout    Comprehension  Verbalized understanding;Returned demonstration       PT Short Term Goals - 06/27/18 1714      PT SHORT TERM GOAL #1   Title  Patient to be independent with initial HEP.    Time  3    Period  Weeks    Status  New    Target Date  07/18/18        PT Long Term Goals - 06/27/18 1715      PT LONG TERM GOAL #1   Title  Patient to be independent with advanced HEP.    Time  6    Period  Weeks    Status  New    Target Date  08/08/18      PT LONG TERM GOAL #2   Title  Patient to demonstrate B LE strength >=4+/5.    Time  6    Period  Weeks    Status  New    Target Date  08/08/18      PT LONG TERM GOAL #3   Title  Patient to demonstrate reciprocal stair climbing down 13 steps with 1 handrail with 50% decrease in LE instability.    Time  6    Period  Weeks    Status  New    Target Date  08/08/18      PT LONG TERM GOAL #4   Title  Patient to score <14 sec on TUG testing without AD in order to decrease risk of falls.    Time  6    Period  Weeks    Status  New    Target Date  08/08/18      PT LONG TERM GOAL #5   Title  Patient to score >/= 19/21 on DGI in order to decrease risk of falls.    Time  6    Period  Weeks    Status  New    Target Date  08/08/18             Plan - 06/27/18 1706    Clinical Impression Statement  Patient is a 28y/o M, PMH significant for HIV encephalopathy, presenting to OPPT with mother with c/o gait disturbance and tremors. Patient reporting B LE tremors when sitting up from a chair, bending forward to the ground, and descending stairs starting 3 months ago. Denies recent falls. Mother  also remarks on patient's slow gait speed. Patient today with mild hip and ankle weakness, decreased gastroc flexibility, B UE intention tremor, difficulty initiating stepping, and gait deviations. Patient scored 17.09 sec on TUG and 17/21 on DGI, both indicating increased risk of falls. Patient educated on stretching and balance HEP to be performed at counter top- patient and mother reported understanding. Would benefit from skilled PT services 1x/week for 6  weeks to address aforementioned impairments.    Personal Factors and Comorbidities  Age;Behavior Pattern;Comorbidity 1;Time since onset of injury/illness/exacerbation    Comorbidities  HIV, AIDS, neurocognitive disorder    Examination-Activity Limitations  Bathing;Bend;Squat;Stairs;Carry;Stand;Toileting;Dressing;Transfers;Hygiene/Grooming;Lift;Locomotion Level;Reach Overhead    Examination-Participation Restrictions  Church;Cleaning;Shop;Community Activity;Yard Work;Interpersonal Relationship    Stability/Clinical Decision Making  Stable/Uncomplicated    Clinical Decision Making  Moderate    Rehab Potential  Good    PT Frequency  1x / week    PT Duration  6 weeks    PT Treatment/Interventions  ADLs/Self Care Home Management;Cryotherapy;Electrical Stimulation;Moist Heat;Balance training;Therapeutic exercise;Therapeutic activities;Functional mobility training;Stair training;Gait training;DME Instruction;Neuromuscular re-education;Patient/family education;Orthotic Fit/Training;Manual techniques;Taping;Energy conservation;Passive range of motion;Vasopneumatic Device    PT Next Visit Plan  reassess HEP    Consulted and Agree with Plan of Care  Patient;Family member/caregiver    Family Member Consulted  mother       Patient will benefit from skilled therapeutic intervention in order to improve the following deficits and impairments:  Abnormal gait, Decreased strength, Difficulty walking, Decreased balance, Decreased range of motion, Improper body  mechanics, Decreased coordination, Decreased safety awareness, Impaired flexibility, Postural dysfunction  Visit Diagnosis: 1. Other abnormalities of gait and mobility   2. Unsteadiness on feet   3. Other symptoms and signs involving the musculoskeletal system        Problem List Patient Active Problem List   Diagnosis Date Noted  . Gait disturbance 06/04/2018  . AIDS (acquired immune deficiency syndrome) ([place]) 03/30/2018  . Vitamin B12 deficiency 05/17/2017  . Neurocognitive disorder 02/21/2017  . Tobacco use 10/24/2016  . Healthcare maintenance 10/24/2016  . HIV (human immunodeficiency virus infection) ([place]) 08/06/2009    Janene Harvey, PT, DPT 06/27/18 5:21 PM    [facility] [address]  [place], [place], [address] Phone: [phone]   Fax:  [phone]  Name: Carl Bradley MRN: [id number] Date of Birth: 1990-08-23   PHYSICAL THERAPY DISCHARGE SUMMARY  Visits from Start of Care: 1  Current functional level related to goals / functional outcomes: Unable to assess; patient was hospitalized and did not return   Remaining deficits: Unable to assess   Education / Equipment: HEP  Plan: Patient agrees to discharge.  Patient goals were not met. Patient is being discharged due to a change in medical status.  ?????     Janene Harvey, PT, DPT 07/29/18 3:19 PM

## 2018-07-01 ENCOUNTER — Other Ambulatory Visit: Payer: Self-pay

## 2018-07-01 ENCOUNTER — Ambulatory Visit (INDEPENDENT_AMBULATORY_CARE_PROVIDER_SITE_OTHER): Payer: Self-pay | Admitting: Neurology

## 2018-07-01 DIAGNOSIS — R269 Unspecified abnormalities of gait and mobility: Secondary | ICD-10-CM

## 2018-07-01 DIAGNOSIS — B20 Human immunodeficiency virus [HIV] disease: Secondary | ICD-10-CM

## 2018-07-01 DIAGNOSIS — G9340 Encephalopathy, unspecified: Secondary | ICD-10-CM

## 2018-07-03 ENCOUNTER — Ambulatory Visit: Payer: Medicaid Other

## 2018-07-04 ENCOUNTER — Telehealth: Payer: Self-pay | Admitting: Infectious Diseases

## 2018-07-04 NOTE — Telephone Encounter (Signed)
Would you do me a favor and give his mom a call to see how Carl Bradley is feeling? He was going to go with [name] to a rehab appt yesterday but had a fever to 101.5. Of course I am concerned about COVID-19 with him.   Please see if he is having any other symptoms (specifically cough, sore throat, shortness of breath).   If so how long has he had symptoms?   Anyone else in the house sick with fever or same symptoms?    Would have [name] wear a facemask for 2 weeks at home and stay in his room as much as he can. Would have him stay away from grandparents during this time also.   We can set up for him to get COVID testing done if needed.

## 2018-07-04 NOTE — Procedures (Signed)
ELECTROENCEPHALOGRAM REPORT  Date of Study: 07/01/2018  Patient's Name: Carl Bradley MRN: [id number] Date of Birth: 1990-09-25  Referring Provider: Dr. Ellouise Newer  Clinical History: This is a 28 year old man with HIV, cognitive impairment, with report of shaking in sleep, enuresis. EEG to assess for seizure activity.  Medications: ZOLOFT 25 MG tablet cyanocobalamin 1000 MCG tablet WELLBUTRIN SR 150 MG 12 hr tablet BIKTARVY 50-200-25 MG TABS tablet BACTRIM 400-80 MG tablet  Technical Summary: A multichannel digital EEG recording measured by the international 10-20 system with electrodes applied with paste and impedances below 5000 ohms performed as portable with EKG monitoring in an awake and drowsy patient.  Hyperventilation was not performed. Photic stimulation was performed.  The digital EEG was referentially recorded, reformatted, and digitally filtered in a variety of bipolar and referential montages for optimal display.   Description: The patient is awake and drowsy during the recording.  During maximal wakefulness, there is a symmetric, medium voltage 7 Hz posterior dominant rhythm that poorly attenuates with eye opening and eye closure. This is admixed with a small amount of diffuse 5-7 Hz theta slowing of the waking background.  During drowsiness, there is an increase in theta slowing of the background.  Deeper stages of sleep were not seen. Photic stimulation did not elicit any abnormalities.  There were no epileptiform discharges or electrographic seizures seen.    EKG lead was unremarkable.  Impression: This awake and drowsy EEG is abnormal due to mild diffuse slowing of the waking background.  Clinical Correlation of the above findings indicates diffuse cerebral dysfunction that is non-specific in etiology and can be seen with hypoxic/ischemic injury, toxic/metabolic encephalopathies, neurodegenerative disorders, or medication effect.  The absence of epileptiform discharges  does not rule out a clinical diagnosis of epilepsy.  Clinical correlation is advised.   Ellouise Newer, M.D.

## 2018-07-04 NOTE — Telephone Encounter (Signed)
Attempted to call patient's mother to see how Eliu was feeling today. Patient's mother states before she left for work Izel was feeling okay. Did not check patient's temperature today. Patient's mother states no one in house hold is wearing a mask, and that no one has shown symptoms of being sick.Patient's mother is currently at work and unable to see how Gopal is feeling. Mother provided Meshilem's grandmother's number to check in.  Attempted to contact Jeani Hawking (Grandmother) to see how patient is doing. Unable to reach her at this time. Left voicemail requesting a call back just to see how Hermann is doing.

## 2018-07-08 ENCOUNTER — Telehealth: Payer: Self-pay

## 2018-07-08 NOTE — Telephone Encounter (Signed)
Pt mother called x3 no answer voice mail left for return call, letter was mailed out to call office to go over EEG results

## 2018-07-10 ENCOUNTER — Ambulatory Visit: Payer: Self-pay

## 2018-07-17 ENCOUNTER — Ambulatory Visit: Payer: Self-pay

## 2018-07-23 ENCOUNTER — Other Ambulatory Visit: Payer: Self-pay

## 2018-07-24 ENCOUNTER — Encounter: Payer: Self-pay | Admitting: Physical Therapy

## 2018-07-29 MED ORDER — DOCUSATE SODIUM 283 MG RE ENEM
283.00 | ENEMA | RECTAL | Status: DC
Start: ? — End: 2018-07-29

## 2018-07-29 MED ORDER — SODIUM CHLORIDE 3 % IN NEBU
3.00 | INHALATION_SOLUTION | RESPIRATORY_TRACT | Status: DC
Start: 2018-07-29 — End: 2018-07-29

## 2018-07-29 MED ORDER — CHLORHEXIDINE GLUCONATE 0.12 % MT SOLN
15.00 | OROMUCOSAL | Status: DC
Start: ? — End: 2018-07-29

## 2018-07-29 MED ORDER — ENOXAPARIN SODIUM 30 MG/0.3ML ~~LOC~~ SOLN
30.00 | SUBCUTANEOUS | Status: DC
Start: 2018-07-29 — End: 2018-07-29

## 2018-07-29 MED ORDER — PROPRANOLOL HCL 10 MG PO TABS
40.00 | ORAL_TABLET | ORAL | Status: DC
Start: 2018-07-29 — End: 2018-07-29

## 2018-07-29 MED ORDER — SODIUM CHLORIDE 0.9 % IV SOLN
250.00 | INTRAVENOUS | Status: DC
Start: ? — End: 2018-07-29

## 2018-07-29 MED ORDER — POLYETHYLENE GLYCOL 3350 17 G PO PACK
17.00 | PACK | ORAL | Status: DC
Start: 2018-09-12 — End: 2018-07-29

## 2018-07-29 MED ORDER — BISACODYL 10 MG RE SUPP
10.00 | RECTAL | Status: DC
Start: ? — End: 2018-07-29

## 2018-07-29 MED ORDER — GENERIC EXTERNAL MEDICATION
Status: DC
Start: ? — End: 2018-07-29

## 2018-07-29 MED ORDER — GENERIC EXTERNAL MEDICATION
1.00 | Status: DC
Start: 2018-07-29 — End: 2018-07-29

## 2018-07-29 MED ORDER — SULFAMETHOXAZOLE-TRIMETHOPRIM 400-80 MG PO TABS
1.00 | ORAL_TABLET | ORAL | Status: DC
Start: 2018-07-30 — End: 2018-07-29

## 2018-07-29 MED ORDER — FAMOTIDINE 20 MG PO TABS
20.00 | ORAL_TABLET | ORAL | Status: DC
Start: 2018-07-29 — End: 2018-07-29

## 2018-07-29 MED ORDER — CHLORHEXIDINE GLUCONATE 0.12 % MT SOLN
15.00 | OROMUCOSAL | Status: DC
Start: 2018-08-15 — End: 2018-07-29

## 2018-07-29 MED ORDER — OXYCODONE-ACETAMINOPHEN 5-325 MG PO TABS
1.00 | ORAL_TABLET | ORAL | Status: DC
Start: ? — End: 2018-07-29

## 2018-07-29 MED ORDER — GENERIC EXTERNAL MEDICATION
1.50 | Status: DC
Start: 2018-07-29 — End: 2018-07-29

## 2018-07-29 MED ORDER — RISPERIDONE 1 MG PO TABS
1.00 | ORAL_TABLET | ORAL | Status: DC
Start: 2018-07-29 — End: 2018-07-29

## 2018-07-29 MED ORDER — GENERIC EXTERNAL MEDICATION
Status: DC
Start: 2018-08-15 — End: 2018-07-29

## 2018-07-29 MED ORDER — IPRATROPIUM BROMIDE 0.02 % IN SOLN
0.50 | RESPIRATORY_TRACT | Status: DC
Start: ? — End: 2018-07-29

## 2018-07-29 MED ORDER — ACETAMINOPHEN 325 MG PO TABS
325.00 | ORAL_TABLET | ORAL | Status: DC
Start: 2018-07-29 — End: 2018-07-29

## 2018-07-29 MED ORDER — SENNOSIDES-DOCUSATE SODIUM 8.6-50 MG PO TABS
2.00 | ORAL_TABLET | ORAL | Status: DC
Start: 2018-09-12 — End: 2018-07-29

## 2018-07-29 MED ORDER — LEVETIRACETAM 500 MG PO TABS
1000.00 | ORAL_TABLET | ORAL | Status: DC
Start: 2018-07-29 — End: 2018-07-29

## 2018-07-29 MED ORDER — ALBUTEROL SULFATE (2.5 MG/3ML) 0.083% IN NEBU
2.50 | INHALATION_SOLUTION | RESPIRATORY_TRACT | Status: DC
Start: 2018-09-12 — End: 2018-07-29

## 2018-07-29 MED ORDER — GENERIC EXTERNAL MEDICATION
5.00 | Status: DC
Start: ? — End: 2018-07-29

## 2018-07-29 MED ORDER — SERTRALINE HCL 25 MG PO TABS
25.00 | ORAL_TABLET | ORAL | Status: DC
Start: 2018-09-12 — End: 2018-07-29

## 2018-07-29 MED ORDER — BICTEGRAVIR-EMTRICITAB-TENOFOV 50-200-25 MG PO TABS
1.00 | ORAL_TABLET | ORAL | Status: DC
Start: 2018-08-16 — End: 2018-07-29

## 2018-07-29 MED ORDER — RACEPINEPHRINE HCL 2.25 % IN NEBU
0.50 | INHALATION_SOLUTION | RESPIRATORY_TRACT | Status: DC
Start: ? — End: 2018-07-29

## 2018-08-06 ENCOUNTER — Encounter: Payer: Self-pay | Admitting: Infectious Diseases

## 2018-08-07 ENCOUNTER — Encounter: Payer: Self-pay | Admitting: Physical Therapy

## 2018-08-09 MED ORDER — GENERIC EXTERNAL MEDICATION
20.00 | Status: DC
Start: ? — End: 2018-08-09

## 2018-08-09 MED ORDER — OXYCODONE HCL 5 MG PO TABS
5.00 | ORAL_TABLET | ORAL | Status: DC
Start: ? — End: 2018-08-09

## 2018-08-09 MED ORDER — MUPIROCIN 2 % EX OINT
TOPICAL_OINTMENT | CUTANEOUS | Status: DC
Start: 2018-08-09 — End: 2018-08-09

## 2018-08-09 MED ORDER — FENTANYL CITRATE (PF) 50 MCG/ML IJ SOLN
50.00 | INTRAMUSCULAR | Status: DC
Start: ? — End: 2018-08-09

## 2018-08-09 MED ORDER — FAMOTIDINE 20 MG PO TABS
20.00 | ORAL_TABLET | ORAL | Status: DC
Start: 2018-09-12 — End: 2018-08-09

## 2018-08-09 MED ORDER — ALTEPLASE 2 MG IJ SOLR
2.00 | INTRAMUSCULAR | Status: DC
Start: ? — End: 2018-08-09

## 2018-08-09 MED ORDER — GENERIC EXTERNAL MEDICATION
20.00 | Status: DC
Start: 2018-09-12 — End: 2018-08-09

## 2018-08-09 MED ORDER — GENERIC EXTERNAL MEDICATION
750.00 | Status: DC
Start: 2018-08-15 — End: 2018-08-09

## 2018-08-09 MED ORDER — ACETAMINOPHEN 500 MG PO TABS
1000.00 | ORAL_TABLET | ORAL | Status: DC
Start: ? — End: 2018-08-09

## 2018-08-12 MED ORDER — GENERIC EXTERNAL MEDICATION
1.00 | Status: DC
Start: 2018-08-16 — End: 2018-08-12

## 2018-08-12 MED ORDER — NYSTATIN 100000 UNIT/GM EX POWD
CUTANEOUS | Status: DC
Start: 2018-08-30 — End: 2018-08-12

## 2018-08-12 MED ORDER — HEPARIN SODIUM (PORCINE) 5000 UNIT/ML IJ SOLN
5000.00 | INTRAMUSCULAR | Status: DC
Start: 2018-08-12 — End: 2018-08-12

## 2018-08-15 MED ORDER — GENERIC EXTERNAL MEDICATION
Status: DC
Start: ? — End: 2018-08-15

## 2018-08-29 MED ORDER — DARUNAVIR ETHANOLATE 100 MG/ML PO SUSP
800.00 | ORAL | Status: DC
Start: 2018-08-30 — End: 2018-08-29

## 2018-08-29 MED ORDER — FOLIC ACID 1 MG PO TABS
1.00 | ORAL_TABLET | ORAL | Status: DC
Start: 2018-08-30 — End: 2018-08-29

## 2018-08-29 MED ORDER — GLUCOSE 40 % PO GEL
15.00 | ORAL | Status: DC
Start: ? — End: 2018-08-29

## 2018-08-29 MED ORDER — DEXTROSE 10 % IV SOLN
125.00 | INTRAVENOUS | Status: DC
Start: ? — End: 2018-08-29

## 2018-08-29 MED ORDER — LEVETIRACETAM 100 MG/ML PO SOLN
750.00 | ORAL | Status: DC
Start: 2018-09-12 — End: 2018-08-29

## 2018-08-29 MED ORDER — RITONAVIR 80 MG/ML PO SOLN
100.00 | ORAL | Status: DC
Start: 2018-09-12 — End: 2018-08-29

## 2018-08-29 MED ORDER — ATOVAQUONE 750 MG/5ML PO SUSP
1500.00 | ORAL | Status: DC
Start: 2018-09-12 — End: 2018-08-29

## 2018-08-29 MED ORDER — CHLORHEXIDINE GLUCONATE 0.12 % MT SOLN
15.00 | OROMUCOSAL | Status: DC
Start: ? — End: 2018-08-29

## 2018-08-29 MED ORDER — CHLORHEXIDINE GLUCONATE 0.12 % MT SOLN
15.00 | OROMUCOSAL | Status: DC
Start: 2018-09-12 — End: 2018-08-29

## 2018-08-29 MED ORDER — GENERIC EXTERNAL MEDICATION
5.00 | Status: DC
Start: 2018-08-30 — End: 2018-08-29

## 2018-08-29 MED ORDER — DOLUTEGRAVIR SODIUM 50 MG PO TABS
50.00 | ORAL_TABLET | ORAL | Status: DC
Start: 2018-09-12 — End: 2018-08-29

## 2018-08-29 MED ORDER — LAMIVUDINE 10 MG/ML PO SOLN
150.00 | ORAL | Status: DC
Start: 2018-09-12 — End: 2018-08-29

## 2018-08-29 MED ORDER — OXYCODONE HCL 5 MG PO TABS
5.00 | ORAL_TABLET | ORAL | Status: DC
Start: 2018-08-30 — End: 2018-08-29

## 2018-09-11 MED ORDER — Medication
Status: DC
Start: ? — End: 2018-09-11

## 2018-09-11 MED ORDER — CALCIUM-VITAMIN D-VITAMIN K 600-1000-90 MG-UNT-MCG PO TABS
ORAL_TABLET | ORAL | Status: DC
Start: ? — End: 2018-09-11

## 2018-09-11 MED ORDER — Medication
2.50 | Status: DC
Start: ? — End: 2018-09-11

## 2018-09-11 MED ORDER — Medication
1.00 | Status: DC
Start: 2018-09-12 — End: 2018-09-11

## 2018-09-11 MED ORDER — Medication
5.00 | Status: DC
Start: 2018-09-12 — End: 2018-09-11

## 2018-09-12 ENCOUNTER — Encounter: Payer: Self-pay | Admitting: Infectious Diseases

## 2018-10-10 DEATH — deceased

## 2019-10-08 MED FILL — predniSONE 20 MG TABS: 20 | 8 days supply | Qty: 12 | Fill #0

## 2023-06-14 ENCOUNTER — Telehealth: Payer: Self-pay

## 2023-06-14 NOTE — Telephone Encounter (Signed)
 Patient came across in out-of-care list.   Appears he passed away 2018/10/10 at [facility].   Lulamae Skorupski, BSN, RN
